# Patient Record
Sex: Female | Born: 1970 | Race: White | Hispanic: No | Marital: Single | Smoking: Current every day smoker
Health system: Southern US, Community
[De-identification: ages and names within clinical notes are randomized; demographics above are authoritative.]

## PROBLEM LIST (undated history)

## (undated) DIAGNOSIS — R140 Abdominal distension (gaseous): Secondary | ICD-10-CM

## (undated) DIAGNOSIS — Z973 Presence of spectacles and contact lenses: Secondary | ICD-10-CM

## (undated) DIAGNOSIS — IMO0002 Reserved for concepts with insufficient information to code with codable children: Secondary | ICD-10-CM

## (undated) DIAGNOSIS — R350 Frequency of micturition: Secondary | ICD-10-CM

## (undated) DIAGNOSIS — G43909 Migraine, unspecified, not intractable, without status migrainosus: Secondary | ICD-10-CM

## (undated) DIAGNOSIS — Z975 Presence of (intrauterine) contraceptive device: Secondary | ICD-10-CM

## (undated) DIAGNOSIS — R109 Unspecified abdominal pain: Secondary | ICD-10-CM

## (undated) HISTORY — DX: Reserved for concepts with insufficient information to code with codable children: IMO0002

## (undated) HISTORY — DX: Presence of spectacles and contact lenses: Z97.3

## (undated) HISTORY — DX: Frequency of micturition: R35.0

## (undated) HISTORY — DX: Abdominal distension (gaseous): R14.0

## (undated) HISTORY — DX: Presence of (intrauterine) contraceptive device: Z97.5

## (undated) HISTORY — DX: Migraine, unspecified, not intractable, without status migrainosus: G43.909

## (undated) HISTORY — DX: Unspecified abdominal pain: R10.9

---

## 2006-02-01 DIAGNOSIS — Z975 Presence of (intrauterine) contraceptive device: Secondary | ICD-10-CM

## 2006-02-01 HISTORY — DX: Presence of (intrauterine) contraceptive device: Z97.5

## 2006-04-02 HISTORY — PX: FRACTURE SURGERY: SHX138

## 2007-11-10 ENCOUNTER — Encounter: Admission: RE | Admit: 2007-11-10 | Discharge: 2007-11-10 | Payer: Self-pay | Admitting: Family Medicine

## 2008-09-20 ENCOUNTER — Other Ambulatory Visit: Admission: RE | Admit: 2008-09-20 | Discharge: 2008-09-20 | Payer: Self-pay | Admitting: Obstetrics and Gynecology

## 2010-11-19 DIAGNOSIS — IMO0002 Reserved for concepts with insufficient information to code with codable children: Secondary | ICD-10-CM

## 2010-11-19 HISTORY — DX: Reserved for concepts with insufficient information to code with codable children: IMO0002

## 2010-12-03 DIAGNOSIS — R350 Frequency of micturition: Secondary | ICD-10-CM

## 2010-12-03 DIAGNOSIS — R109 Unspecified abdominal pain: Secondary | ICD-10-CM

## 2010-12-03 DIAGNOSIS — R140 Abdominal distension (gaseous): Secondary | ICD-10-CM

## 2010-12-03 HISTORY — DX: Unspecified abdominal pain: R10.9

## 2010-12-03 HISTORY — DX: Frequency of micturition: R35.0

## 2010-12-03 HISTORY — DX: Abdominal distension (gaseous): R14.0

## 2011-07-14 ENCOUNTER — Emergency Department (HOSPITAL_COMMUNITY)
Admission: EM | Admit: 2011-07-14 | Discharge: 2011-07-15 | Disposition: A | Discharge status: Home / Self Care | Payer: BC Managed Care – PPO | Attending: Emergency Medicine | Admitting: Emergency Medicine

## 2011-07-14 ENCOUNTER — Encounter (HOSPITAL_COMMUNITY): Payer: Self-pay | Admitting: *Deleted

## 2011-07-14 DIAGNOSIS — F17200 Nicotine dependence, unspecified, uncomplicated: Secondary | ICD-10-CM | POA: Insufficient documentation

## 2011-07-14 DIAGNOSIS — E162 Hypoglycemia, unspecified: Secondary | ICD-10-CM

## 2011-07-14 DIAGNOSIS — R609 Edema, unspecified: Secondary | ICD-10-CM

## 2011-07-14 LAB — POCT I-STAT, CHEM 8
BUN: 13 mg/dL (ref 6–23)
Calcium, Ion: 1.2 mmol/L (ref 1.12–1.32)
Chloride: 101 mEq/L (ref 96–112)
Creatinine, Ser: 0.8 mg/dL (ref 0.50–1.10)
Potassium: 4 mEq/L (ref 3.5–5.1)
Sodium: 136 mEq/L (ref 135–145)
TCO2: 22 mmol/L (ref 0–100)

## 2011-07-14 NOTE — ED Notes (Signed)
Received report, pt. Alert and oriented, NAD noted, lab at the bedside

## 2011-07-14 NOTE — ED Notes (Addendum)
C/o facial and arm swelling (discenable to pt only) , "feel uncomfortable & bloated", sx peaked around 2000.

## 2011-07-15 LAB — CBC
MCV: 98.9 fL (ref 78.0–100.0)
Platelets: 185 10*3/uL (ref 150–400)
RBC: 3.58 MIL/uL — ABNORMAL LOW (ref 3.87–5.11)
RDW: 12.9 % (ref 11.5–15.5)
WBC: 5.8 10*3/uL (ref 4.0–10.5)

## 2011-07-15 LAB — DIFFERENTIAL
Basophils Absolute: 0 10*3/uL (ref 0.0–0.1)
Basophils Relative: 1 % (ref 0–1)
Lymphs Abs: 2.1 10*3/uL (ref 0.7–4.0)
Monocytes Absolute: 0.6 10*3/uL (ref 0.1–1.0)
Monocytes Relative: 11 % (ref 3–12)
Neutrophils Relative %: 52 % (ref 43–77)

## 2011-07-15 MED ORDER — HYDROCHLOROTHIAZIDE 25 MG PO TABS: 25.0000 mg | ORAL_TABLET | Freq: Every day | ORAL | Status: DC

## 2011-07-15 NOTE — Discharge Instructions (Signed)
Hypoglycemia (Low Blood Sugar) Hypoglycemia is when the glucose (sugar) in your blood is too low. Hypoglycemia can happen for many reasons. It can happen to people with or without diabetes. Hypoglycemia can develop quickly and can be a medical emergency.  CAUSES  Having hypoglycemia does not mean that you will develop diabetes. Different causes include:  Missed or delayed meals or not enough carbohydrates eaten.   Medication overdose. This could be by accident or deliberate. If by accident, your medication may need to be adjusted or changed.   Exercise or increased activity without adjustments in carbohydrates or medications.   A nerve disorder that affects body functions like your heart rate, blood pressure and digestion (autonomic neuropathy).   A condition where the stomach muscles do not function properly (gastroparesis). Therefore, medications may not absorb properly.   The inability to recognize the signs of hypoglycemia (hypoglycemic unawareness).   Absorption of insulin - may be altered.   Alcohol consumption.   Pregnancy/menstrual cycles/postpartum. This may be due to hormones.   Certain kinds of tumors. This is very rare.  SYMPTOMS   Sweating.   Hunger.   Dizziness.   Blurred vision.   Drowsiness.   Weakness.   Headache.   Rapid heart beat.   Shakiness.   Nervousness.  DIAGNOSIS  Diagnosis is made by monitoring blood glucose in one or all of the following ways:  Fingerstick blood glucose monitoring.   Laboratory results.  TREATMENT  If you think your blood glucose is low:  Check your blood glucose, if possible. If it is less than 70 mg/dl, take one of the following:   3-4 glucose tablets.    cup juice (prefer clear like apple).    cup "regular" soda pop.   1 cup milk.   -1 tube of glucose gel.   5-6 hard candies.   Do not over treat because your blood glucose (sugar) will only go too high.   Wait 15 minutes and recheck your blood  glucose. If it is still less than 70 mg/dl (or below your target range), repeat treatment.   Eat a snack if it is more than one hour until your next meal.  Sometimes, your blood glucose may go so low that you are unable to treat yourself. You may need someone to help you. You may even pass out or be unable to swallow. This may require you to get an injection of glucagon, which raises the blood glucose. HOME CARE INSTRUCTIONS  Check blood glucose as recommended by your caregiver.   Take medication as prescribed by your caregiver.   Follow your meal plan. Do not skip meals. Eat on time.   If you are going to drink alcohol, drink it only with meals.   Check your blood glucose before driving.   Check your blood glucose before and after exercise. If you exercise longer or different than usual, be sure to check blood glucose more frequently.   Always carry treatment with you. Glucose tablets are the easiest to carry.   Always wear medical alert jewelry or carry some form of identification that states that you have diabetes. This will alert people that you have diabetes. If you have hypoglycemia, they will have a better idea on what to do.  SEEK MEDICAL CARE IF:   You are having problems keeping your blood sugar at target range.   You are having frequent episodes of hypoglycemia.   You feel you might be having side effects from your medicines.  You have symptoms of an illness that is not improving after 3-4 days.   You notice a change in vision or a new problem with your vision.  SEEK IMMEDIATE MEDICAL CARE IF:   You are a family member or friend of a person whose blood glucose goes below 70 mg/dl and is accompanied by:   Confusion.   A change in mental status.   The inability to swallow.   Passing out.  Document Released: 01/18/2005 Document Revised: 01/07/2011 Document Reviewed: 09/12/2008 [facility] Patient Information 2012 [place], [place].Peripheral Edema You have swelling in  your legs (peripheral edema). This swelling is due to excess accumulation of salt and water in your body. Edema may be a sign of heart, kidney or liver disease, or a side effect of a medication. It may also be due to problems in the leg veins. Elevating your legs and using special support stockings may be very helpful, if the cause of the swelling is due to poor venous circulation. Avoid long periods of standing, whatever the cause. Treatment of edema depends on identifying the cause. Chips, pretzels, pickles and other salty foods should be avoided. Restricting salt in your diet is almost always needed. Water pills (diuretics) are often used to remove the excess salt and water from your body via urine. These medicines prevent the kidney from reabsorbing sodium. This increases urine flow. Diuretic treatment may also result in lowering of potassium levels in your body. Potassium supplements may be needed if you have to use diuretics daily. Daily weights can help you keep track of your progress in clearing your edema. You should call your caregiver for follow up care as recommended. SEEK IMMEDIATE MEDICAL CARE IF:   You have increased swelling, pain, redness, or heat in your legs.   You develop shortness of breath, especially when lying down.   You develop chest or abdominal pain, weakness, or fainting.   You have a fever.  Document Released: 02/26/2004 Document Revised: 01/07/2011 Document Reviewed: 02/05/2009 [facility] Patient Information 2012 [place], [place].

## 2011-07-15 NOTE — ED Notes (Signed)
Pt. C/o sweatiness, pt. Alert and oriented, blood glucose 50, crackers and peanut butter given

## 2011-07-15 NOTE — ED Notes (Signed)
Pt. Alert and oriented, NAD noted 

## 2011-07-15 NOTE — ED Provider Notes (Signed)
History     CSN: [id number]  Arrival date & time 07/14/11  2236   First MD Initiated Contact with Patient 07/14/11 2301      Chief Complaint  Patient presents with  . Facial Swelling  . Arm Swelling    (Consider location/radiation/quality/duration/timing/severity/associated sxs/prior treatment) HPI Comments: Patient here with generalized swelling of face and bilateral forearms and hands for 1 day.  She reports this has never happened before.  States no new soaps, facial creams, injury.  Denies tongue or lip swelling, shortness of breath, difficulty swallowing or sensation of throat closing, neck pain, headache, chest pain, abdominal pain.  States that she just feels bloated all over.  The history is provided by the patient. No language interpreter was used.    History reviewed. No pertinent past medical history.  Past Surgical History  Procedure Date  . Fracture surgery     No family history on file.  History  Substance Use Topics  . Smoking status: Current Everyday Smoker  . Smokeless tobacco: Not on file  . Alcohol Use: Yes    OB History    Grav Para Term Preterm Abortions TAB SAB Ect Mult Living                  Review of Systems  HENT: Positive for facial swelling.   Gastrointestinal: Positive for abdominal distention.  All other systems reviewed and are negative.    Allergies  Review of patient's allergies indicates no known allergies.  Home Medications   Current Outpatient Rx  Name Route Sig Dispense Refill  . SENNA 8.6 MG PO TABS Oral Take 1 tablet by mouth daily as needed. For constipation      BP 129/81  Pulse 83  Temp 97.9 F (36.6 C) (Oral)  Resp 16  SpO2 100%  LMP 05/04/2011  Physical Exam  Nursing note and vitals reviewed. Constitutional: She is oriented to person, place, and time. She appears well-developed and well-nourished. No distress.  HENT:  Head: Normocephalic and atraumatic.  Right Ear: External ear normal.  Left Ear:  External ear normal.  Nose: Nose normal.  Mouth/Throat: Oropharynx is clear and moist. No oropharyngeal exudate.       No appreciable swelling noted - no angioedema  Eyes: Conjunctivae are normal. Pupils are equal, round, and reactive to light. No scleral icterus.  Neck: Normal range of motion. Neck supple.  Cardiovascular: Normal rate, regular rhythm and normal heart sounds.  Exam reveals no gallop and no friction rub.   No murmur heard. Pulmonary/Chest: Effort normal and breath sounds normal. No respiratory distress. She has no wheezes. She has no rales. She exhibits no tenderness.  Abdominal: Soft. Bowel sounds are normal. She exhibits no distension. There is no tenderness.  Musculoskeletal: Normal range of motion. She exhibits no edema and no tenderness.  Lymphadenopathy:    She has no cervical adenopathy.  Neurological: She is alert and oriented to person, place, and time. No cranial nerve deficit.  Skin: Skin is warm and dry. No rash noted. No erythema. No pallor.  Psychiatric: She has a normal mood and affect. Her behavior is normal. Judgment and thought content normal.    ED Course  Procedures (including critical care time)  Labs Reviewed  CBC - Abnormal; Notable for the following:    RBC 3.58 (*)     HCT 35.4 (*)     MCH 35.2 (*)     All other components within normal limits  POCT I-STAT, CHEM 8 -  Abnormal; Notable for the following:    Glucose, Bld 50 (*)     All other components within normal limits  DIFFERENTIAL   No results found.   Hypoglycemia Peripheral edema   MDM  Patient with no noticeable edema but noted with hypoglycemia on lab - states feels a little sweaty and clammy - given juice and crackers and feels better before discharge.        Izola Price [place], [place] 07/15/11 [phone]

## 2011-07-15 NOTE — ED Provider Notes (Signed)
Medical screening examination/treatment/procedure(s) were performed by non-physician practitioner and as supervising physician I was immediately available for consultation/collaboration.  Mckinzey Entwistle Smitty Cords, MD 07/15/11 0130

## 2011-11-03 ENCOUNTER — Other Ambulatory Visit: Payer: Self-pay | Admitting: Obstetrics and Gynecology

## 2011-11-03 ENCOUNTER — Ambulatory Visit (INDEPENDENT_AMBULATORY_CARE_PROVIDER_SITE_OTHER): Payer: BC Managed Care – PPO | Admitting: Obstetrics and Gynecology

## 2011-11-03 ENCOUNTER — Encounter: Payer: Self-pay | Admitting: Obstetrics and Gynecology

## 2011-11-03 VITALS — BP 112/70 | Wt 120.0 lb

## 2011-11-03 DIAGNOSIS — R142 Eructation: Secondary | ICD-10-CM

## 2011-11-03 DIAGNOSIS — R141 Gas pain: Secondary | ICD-10-CM

## 2011-11-03 DIAGNOSIS — N926 Irregular menstruation, unspecified: Secondary | ICD-10-CM

## 2011-11-03 DIAGNOSIS — R140 Abdominal distension (gaseous): Secondary | ICD-10-CM

## 2011-11-03 DIAGNOSIS — Z1231 Encounter for screening mammogram for malignant neoplasm of breast: Secondary | ICD-10-CM

## 2011-11-03 LAB — PROLACTIN: Prolactin: 4.7 ng/mL

## 2011-11-03 LAB — TSH: TSH: 1.041 u[IU]/mL (ref 0.350–4.500)

## 2011-11-03 NOTE — Progress Notes (Signed)
HISTORY OF PRESENT ILLNESS  [name] is a 41 y.o. year old female,G1P0010, who presents for a problem visit. The patient has a ParaGard IUD.  She is not sexually active.  She has a history of abdominal bloating.  Ultrasound was negative last year.  CBC is normal by her primary physician.  Chemistries are normal.  Subjective:  Complains of continued bloating.  Her father has celiac disease.  Also complains of irregular bleeding.  Change in sleep habits.  No hot flashes, or increased emotions.  Objective:  BP 112/70  Wt 120 lb (54.432 kg)  LMP 10/28/2011   General: no distress Resp: clear to auscultation bilaterally Cardio: regular rate and rhythm, S1, S2 normal, no murmur, click, rub or gallop GI: soft and nontender, no masses  External genitalia: normal general appearance Vaginal: normal without tenderness, induration or masses Cervix: normal appearance and IUD string visualized Adnexa: normal bimanual exam Uterus: normal size shape and consistency  Assessment:  Irregular menstrual cycles Abdominal bloating Change in sleep habits  Plan:  TSH, prolactin Mammogram Refer to a gastroenterologist Return to review with results and for annual exam.  Return to office in 4 week(s).   Leonard Schwartz M.D.  11/03/2011 9:13 AM    Pt c/o bloating, changes in bowel movements, irregular periods, and intermittent back pain. Pt states her period is different each month.  When did bleeding start: 10/28/11 How  Long: 5 days How often changing pad/tampon: super tampon every 2 hours during heaviest flow Bleeding Disorders: no Cramping: no Contraception: yes Paragard Fibroids: no Hormone Therapy: no New Medications: no Menopausal Symptoms: no Vag. Discharge: no Abdominal Pain: no Increased Stress: no

## 2011-11-04 NOTE — Progress Notes (Signed)
Quick Note:  Send a letter that her labs are normal. Include a copy of the labs with the letter. ______ 

## 2011-11-05 ENCOUNTER — Encounter (HOSPITAL_COMMUNITY): Payer: Self-pay | Admitting: Emergency Medicine

## 2011-11-05 ENCOUNTER — Encounter (HOSPITAL_COMMUNITY): Payer: Self-pay | Admitting: *Deleted

## 2011-11-05 ENCOUNTER — Emergency Department (HOSPITAL_COMMUNITY)
Admission: EM | Admit: 2011-11-05 | Discharge: 2011-11-05 | Disposition: A | Discharge status: Home / Self Care | Payer: Self-pay | Attending: Emergency Medicine | Admitting: Emergency Medicine

## 2011-11-05 DIAGNOSIS — M79609 Pain in unspecified limb: Secondary | ICD-10-CM | POA: Insufficient documentation

## 2011-11-05 DIAGNOSIS — F17200 Nicotine dependence, unspecified, uncomplicated: Secondary | ICD-10-CM | POA: Insufficient documentation

## 2011-11-05 DIAGNOSIS — R51 Headache: Secondary | ICD-10-CM | POA: Insufficient documentation

## 2011-11-05 DIAGNOSIS — IMO0001 Reserved for inherently not codable concepts without codable children: Secondary | ICD-10-CM | POA: Insufficient documentation

## 2011-11-05 DIAGNOSIS — E876 Hypokalemia: Secondary | ICD-10-CM | POA: Insufficient documentation

## 2011-11-05 LAB — CBC WITH DIFFERENTIAL/PLATELET
Basophils Absolute: 0 10*3/uL (ref 0.0–0.1)
Basophils Relative: 0 % (ref 0–1)
Eosinophils Relative: 2 % (ref 0–5)
HCT: 36.3 % (ref 36.0–46.0)
Lymphs Abs: 2.3 10*3/uL (ref 0.7–4.0)
MCH: 34.6 pg — ABNORMAL HIGH (ref 26.0–34.0)
MCV: 95 fL (ref 78.0–100.0)
Monocytes Absolute: 0.3 10*3/uL (ref 0.1–1.0)
Monocytes Relative: 6 % (ref 3–12)
Platelets: 194 10*3/uL (ref 150–400)

## 2011-11-05 LAB — POCT I-STAT, CHEM 8
Calcium, Ion: 1.17 mmol/L (ref 1.12–1.23)
Glucose, Bld: 78 mg/dL (ref 70–99)
Hemoglobin: 13.3 g/dL (ref 12.0–15.0)
Potassium: 3.4 mEq/L — ABNORMAL LOW (ref 3.5–5.1)
TCO2: 24 mmol/L (ref 0–100)

## 2011-11-05 LAB — POCT PREGNANCY, URINE: Preg Test, Ur: NEGATIVE

## 2011-11-05 NOTE — ED Notes (Addendum)
C/o intermitant muscle "tension" for ~ 2 months, more constant in the last 2d, denies pain, "just tension", pinpoints to arms & legs mostly and some in back. Also facial pressure and HA (severe last night, better now). Took advil today.  Also "smokey vision". Was here earlier tonight, but "had to leave to feed animals". Admits to 2 glasses of wine PTA ("no relief from tension").

## 2011-11-05 NOTE — ED Notes (Signed)
Pt c/o intermittent muscle soreness and tightness x 2 months that was worse over last two days; pt sts frontal HA x 2 days also

## 2011-11-06 ENCOUNTER — Emergency Department (HOSPITAL_COMMUNITY)
Admission: EM | Admit: 2011-11-06 | Discharge: 2011-11-06 | Disposition: A | Discharge status: Home / Self Care | Payer: BC Managed Care – PPO | Attending: Emergency Medicine | Admitting: Emergency Medicine

## 2011-11-06 DIAGNOSIS — M6289 Other specified disorders of muscle: Secondary | ICD-10-CM

## 2011-11-06 DIAGNOSIS — E876 Hypokalemia: Secondary | ICD-10-CM

## 2011-11-06 LAB — URINALYSIS, ROUTINE W REFLEX MICROSCOPIC
Bilirubin Urine: NEGATIVE
Glucose, UA: NEGATIVE mg/dL
Hgb urine dipstick: NEGATIVE
Ketones, ur: NEGATIVE mg/dL
Nitrite: NEGATIVE
Specific Gravity, Urine: 1.004 — ABNORMAL LOW (ref 1.005–1.030)
Urobilinogen, UA: 0.2 mg/dL (ref 0.0–1.0)

## 2011-11-06 LAB — CK: Total CK: 92 U/L (ref 7–177)

## 2011-11-06 MED ORDER — POTASSIUM CHLORIDE CRYS ER 20 MEQ PO TBCR
30.0000 meq | EXTENDED_RELEASE_TABLET | Freq: Once | ORAL | Status: AC
  Administered 2011-11-06: 30 meq via ORAL
  Filled 2011-11-06: qty 2

## 2011-11-06 NOTE — ED Provider Notes (Signed)
History     CSN: [id number]  Arrival date & time 11/05/11  2225   First MD Initiated Contact with Patient 11/06/11 0021      Chief Complaint  Patient presents with  . Muscle Pain    (Consider location/radiation/quality/duration/timing/severity/associated sxs/prior treatment) HPI Comments: PAtient describes muscel fatigue as thought she has been working out   Patient is a 41 y.o. female presenting with musculoskeletal pain. The history is provided by the patient.  Muscle Pain This is a recurrent problem. The problem occurs constantly. Pertinent negatives include no chest pain, chills, coughing, fever, myalgias, numbness, rash, vomiting or weakness.    Past Medical History  Diagnosis Date  . Abdominal pain 12/2010  . Bloating 12/2010  . Urinary frequency 12/2010  . Abnormal Pap smear 11/19/10    ASCUS    Past Surgical History  Procedure Date  . Fracture surgery     No family history on file.  History  Substance Use Topics  . Smoking status: Current Every Day Smoker  . Smokeless tobacco: Not on file  . Alcohol Use: Yes    OB History    Grav Para Term Preterm Abortions TAB SAB Ect Mult Living   1 0   1     0      Review of Systems  Constitutional: Negative for fever and chills.  Respiratory: Negative for cough.   Cardiovascular: Negative for chest pain and palpitations.  Gastrointestinal: Negative for vomiting and diarrhea.  Musculoskeletal: Negative for myalgias.  Skin: Negative for rash and wound.  Neurological: Negative for dizziness, weakness and numbness.    Allergies  Review of patient's allergies indicates no known allergies.  Home Medications   Current Outpatient Rx  Name Route Sig Dispense Refill  . IBUPROFEN 100 MG PO TABS Oral Take 1-2 mg by mouth every 6 (six) hours as needed. For pain    . ADULT MULTIVITAMIN W/MINERALS CH Oral Take 1 tablet by mouth daily.      BP 127/81  Pulse 74  Temp 98.1 F (36.7 C) (Oral)  Resp 14  SpO2 99%   LMP 10/28/2011  Physical Exam  Constitutional: She is oriented to person, place, and time. She appears well-developed and well-nourished.  HENT:  Head: Normocephalic.  Neck: Normal range of motion.  Cardiovascular: Normal rate.   Abdominal: Soft.  Musculoskeletal: Normal range of motion. She exhibits no edema and no tenderness.  Neurological: She is alert and oriented to person, place, and time.  Skin: Skin is warm and dry. No pallor.    ED Course  Procedures (including critical care time)  Labs Reviewed  URINALYSIS, ROUTINE W REFLEX MICROSCOPIC - Abnormal; Notable for the following:    Specific Gravity, Urine 1.004 (*)     All other components within normal limits  CBC WITH DIFFERENTIAL - Abnormal; Notable for the following:    RBC 3.82 (*)     MCH 34.6 (*)     MCHC 36.4 (*)     All other components within normal limits  POCT I-STAT, CHEM 8 - Abnormal; Notable for the following:    Sodium 133 (*)     Potassium 3.4 (*)     Chloride 95 (*)     All other components within normal limits  POCT PREGNANCY, URINE  CK   No results found.   1. Hypokalemia   2. Muscle fatigue       MDM   Denies dehydration recent illness Discussed increasing food rich in K+ and PCP  followup         Arman Filter, NP 11/06/11 [phone]

## 2011-11-06 NOTE — ED Notes (Signed)
Pt states that she has been having muscle cramps for the past 2 days. Pt states that the cramps are in both hands, arms and legs. Pt states cramps are constant. Pt denies any new exercise, denies new activities. Pt just states she has been having cramps.

## 2011-11-07 NOTE — ED Provider Notes (Signed)
Medical screening examination/treatment/procedure(s) were performed by non-physician practitioner and as supervising physician I was immediately available for consultation/collaboration.    Vida Roller, MD 11/07/11 [phone]

## 2011-11-09 ENCOUNTER — Ambulatory Visit: Payer: Self-pay

## 2011-11-30 ENCOUNTER — Ambulatory Visit: Payer: BC Managed Care – PPO | Admitting: Obstetrics and Gynecology

## 2012-04-26 HISTORY — PX: APPENDECTOMY: SHX54

## 2012-06-25 ENCOUNTER — Encounter (HOSPITAL_COMMUNITY): Payer: Self-pay | Admitting: *Deleted

## 2012-06-25 DIAGNOSIS — K3580 Unspecified acute appendicitis: Secondary | ICD-10-CM | POA: Diagnosis present

## 2012-06-25 DIAGNOSIS — R1031 Right lower quadrant pain: Secondary | ICD-10-CM | POA: Insufficient documentation

## 2012-06-25 DIAGNOSIS — K3533 Acute appendicitis with perforation and localized peritonitis, with abscess: Principal | ICD-10-CM | POA: Insufficient documentation

## 2012-06-25 LAB — CBC WITH DIFFERENTIAL/PLATELET
Basophils Absolute: 0 10*3/uL (ref 0.0–0.1)
Basophils Relative: 0 % (ref 0–1)
Eosinophils Absolute: 0 10*3/uL (ref 0.0–0.7)
Eosinophils Relative: 0 % (ref 0–5)
Lymphocytes Relative: 20 % (ref 12–46)
MCV: 94.7 fL (ref 78.0–100.0)
Monocytes Absolute: 1 10*3/uL (ref 0.1–1.0)
Neutro Abs: 9.2 10*3/uL — ABNORMAL HIGH (ref 1.7–7.7)
Neutrophils Relative %: 71 % (ref 43–77)
Platelets: 190 10*3/uL (ref 150–400)
RBC: 3.98 MIL/uL (ref 3.87–5.11)
RDW: 12.5 % (ref 11.5–15.5)

## 2012-06-25 LAB — COMPREHENSIVE METABOLIC PANEL
AST: 21 U/L (ref 0–37)
Albumin: 4.2 g/dL (ref 3.5–5.2)
BUN: 16 mg/dL (ref 6–23)
CO2: 21 mEq/L (ref 19–32)
Calcium: 9.6 mg/dL (ref 8.4–10.5)
Chloride: 92 mEq/L — ABNORMAL LOW (ref 96–112)
GFR calc Af Amer: >90 mL/min (ref >90)
GFR calc non Af Amer: >90 mL/min (ref >90)
Glucose, Bld: 89 mg/dL (ref 70–99)
Potassium: 3.3 mEq/L — ABNORMAL LOW (ref 3.5–5.1)
Sodium: 129 mEq/L — ABNORMAL LOW (ref 135–145)

## 2012-06-25 LAB — URINALYSIS, ROUTINE W REFLEX MICROSCOPIC
Bilirubin Urine: NEGATIVE
Glucose, UA: NEGATIVE mg/dL
Ketones, ur: NEGATIVE mg/dL
Urobilinogen, UA: 0.2 mg/dL (ref 0.0–1.0)

## 2012-06-25 LAB — POCT PREGNANCY, URINE: Preg Test, Ur: NEGATIVE

## 2012-06-25 LAB — URINE MICROSCOPIC-ADD ON

## 2012-06-25 NOTE — ED Notes (Signed)
Pt had blood work done today by [employer].

## 2012-06-25 NOTE — ED Notes (Signed)
PT. STRONGLY DECLINED IV START AT TRIAGE AND IV PAIN ( PROTOCOL ) MEDICATION .

## 2012-06-25 NOTE — ED Notes (Signed)
Pt c/o of RLQ abdominal since 10:00 am, that is tender to palpation. State she feels like she has to have a bowel movement. Last BM at 1300pm.

## 2012-06-25 NOTE — ED Notes (Signed)
NURSE FIRST ROUNDS : NURSE EXPLAINED DELAY , WAIT TIME AND PROCESS , RESPIRATIONS UNLABORED , PT. STSTED PAIN AT LOWER ABDOMEN PERSIST , WILL DRAW BLOOD SPECIMEN FOR CBC AND CMET.

## 2012-06-26 ENCOUNTER — Emergency Department (HOSPITAL_COMMUNITY): Payer: BC Managed Care – PPO

## 2012-06-26 ENCOUNTER — Encounter (HOSPITAL_COMMUNITY): Payer: Self-pay | Admitting: Certified Registered"

## 2012-06-26 ENCOUNTER — Observation Stay (HOSPITAL_COMMUNITY)
Admission: EM | Admit: 2012-06-26 | Discharge: 2012-06-27 | Disposition: A | Discharge status: Home / Self Care | Payer: BC Managed Care – PPO | Attending: General Surgery | Admitting: General Surgery

## 2012-06-26 ENCOUNTER — Encounter (HOSPITAL_COMMUNITY): Payer: Self-pay | Admitting: Radiology

## 2012-06-26 ENCOUNTER — Emergency Department (HOSPITAL_COMMUNITY): Payer: BC Managed Care – PPO | Admitting: Certified Registered"

## 2012-06-26 ENCOUNTER — Encounter (HOSPITAL_COMMUNITY)
Admission: EM | Disposition: A | Discharge status: Home / Self Care | Payer: Self-pay | Source: Home / Self Care | Attending: Emergency Medicine

## 2012-06-26 DIAGNOSIS — K3580 Unspecified acute appendicitis: Secondary | ICD-10-CM | POA: Diagnosis present

## 2012-06-26 HISTORY — PX: LAPAROSCOPIC APPENDECTOMY: SHX408

## 2012-06-26 SURGERY — APPENDECTOMY, LAPAROSCOPIC
Anesthesia: General | Site: Abdomen | Wound class: Clean

## 2012-06-26 MED ORDER — IOHEXOL 300 MG/ML  SOLN
80.0000 mL | Freq: Once | INTRAMUSCULAR | Status: AC | PRN
  Administered 2012-06-26: 80 mL via INTRAVENOUS

## 2012-06-26 MED ORDER — ONDANSETRON HCL 4 MG/2ML IJ SOLN: 4.0000 mg | Freq: Four times a day (QID) | INTRAMUSCULAR | Status: DC | PRN

## 2012-06-26 MED ORDER — MEPERIDINE HCL 25 MG/ML IJ SOLN: 6.2500 mg | INTRAMUSCULAR | Status: DC | PRN

## 2012-06-26 MED ORDER — SODIUM CHLORIDE 0.9 % IV SOLN
1.0000 g | Freq: Once | INTRAVENOUS | Status: AC
  Administered 2012-06-26: 1 g via INTRAVENOUS
  Filled 2012-06-26: qty 1

## 2012-06-26 MED ORDER — SODIUM CHLORIDE 0.9 % IR SOLN
Status: DC | PRN
  Administered 2012-06-26: 1

## 2012-06-26 MED ORDER — PROPOFOL 10 MG/ML IV BOLUS
INTRAVENOUS | Status: DC | PRN
  Administered 2012-06-26: 160 mg via INTRAVENOUS

## 2012-06-26 MED ORDER — BUPIVACAINE-EPINEPHRINE 0.25% -1:200000 IJ SOLN
INTRAMUSCULAR | Status: DC | PRN
  Administered 2012-06-26: 10 mL

## 2012-06-26 MED ORDER — PROMETHAZINE HCL 25 MG/ML IJ SOLN: 6.2500 mg | INTRAMUSCULAR | Status: DC | PRN

## 2012-06-26 MED ORDER — SODIUM CHLORIDE 0.9 % IV SOLN
INTRAVENOUS | Status: DC
  Administered 2012-06-26: 13:00:00 via INTRAVENOUS

## 2012-06-26 MED ORDER — OXYCODONE HCL 5 MG PO TABS: 5.0000 mg | ORAL_TABLET | ORAL | Status: DC | PRN

## 2012-06-26 MED ORDER — FENTANYL CITRATE 0.05 MG/ML IJ SOLN
INTRAMUSCULAR | Status: DC | PRN
  Administered 2012-06-26: 100 ug via INTRAVENOUS
  Administered 2012-06-26: 50 ug via INTRAVENOUS
  Administered 2012-06-26: 100 ug via INTRAVENOUS

## 2012-06-26 MED ORDER — DEXAMETHASONE SODIUM PHOSPHATE 4 MG/ML IJ SOLN
INTRAMUSCULAR | Status: DC | PRN
  Administered 2012-06-26: 8 mg via INTRAVENOUS

## 2012-06-26 MED ORDER — DEXTROSE 5 % IV SOLN
2.0000 g | Freq: Four times a day (QID) | INTRAVENOUS | Status: AC
  Administered 2012-06-26 (×3): 2 g via INTRAVENOUS
  Filled 2012-06-26 (×3): qty 2

## 2012-06-26 MED ORDER — LIDOCAINE HCL (CARDIAC) 20 MG/ML IV SOLN
INTRAVENOUS | Status: DC | PRN
  Administered 2012-06-26: 70 mg via INTRAVENOUS

## 2012-06-26 MED ORDER — GLYCOPYRROLATE 0.2 MG/ML IJ SOLN
INTRAMUSCULAR | Status: DC | PRN
  Administered 2012-06-26: 0.4 mg via INTRAVENOUS

## 2012-06-26 MED ORDER — MIDAZOLAM HCL 5 MG/5ML IJ SOLN
INTRAMUSCULAR | Status: DC | PRN
  Administered 2012-06-26: 2 mg via INTRAVENOUS

## 2012-06-26 MED ORDER — NEOSTIGMINE METHYLSULFATE 1 MG/ML IJ SOLN
INTRAMUSCULAR | Status: DC | PRN
  Administered 2012-06-26: 3 mg via INTRAVENOUS

## 2012-06-26 MED ORDER — OXYCODONE HCL 5 MG/5ML PO SOLN: 5.0000 mg | Freq: Once | ORAL | Status: DC | PRN

## 2012-06-26 MED ORDER — MORPHINE SULFATE 2 MG/ML IJ SOLN: 2.0000 mg | INTRAMUSCULAR | Status: DC | PRN

## 2012-06-26 MED ORDER — MINOCYCLINE HCL 100 MG PO CAPS
100.0000 mg | ORAL_CAPSULE | Freq: Two times a day (BID) | ORAL | Status: DC
  Administered 2012-06-26 – 2012-06-27 (×2): 100 mg via ORAL
  Filled 2012-06-26 (×3): qty 1

## 2012-06-26 MED ORDER — ONDANSETRON HCL 4 MG/2ML IJ SOLN
INTRAMUSCULAR | Status: DC | PRN
  Administered 2012-06-26: 4 mg via INTRAVENOUS

## 2012-06-26 MED ORDER — IBUPROFEN 200 MG PO TABS
200.0000 mg | ORAL_TABLET | Freq: Four times a day (QID) | ORAL | Status: DC | PRN
  Filled 2012-06-26 (×2): qty 1

## 2012-06-26 MED ORDER — IOHEXOL 300 MG/ML  SOLN
50.0000 mL | Freq: Once | INTRAMUSCULAR | Status: AC | PRN
  Administered 2012-06-26: 50 mL via ORAL

## 2012-06-26 MED ORDER — ROCURONIUM BROMIDE 100 MG/10ML IV SOLN
INTRAVENOUS | Status: DC | PRN
  Administered 2012-06-26: 40 mg via INTRAVENOUS
  Administered 2012-06-26: 10 mg via INTRAVENOUS

## 2012-06-26 MED ORDER — LACTATED RINGERS IV SOLN
INTRAVENOUS | Status: DC | PRN
  Administered 2012-06-26 (×2): via INTRAVENOUS

## 2012-06-26 MED ORDER — SODIUM CHLORIDE 0.9 % IV BOLUS (SEPSIS)
1000.0000 mL | Freq: Once | INTRAVENOUS | Status: AC
  Administered 2012-06-26: 1000 mL via INTRAVENOUS

## 2012-06-26 MED ORDER — ACETAMINOPHEN 325 MG PO TABS
650.0000 mg | ORAL_TABLET | Freq: Four times a day (QID) | ORAL | Status: DC | PRN
  Administered 2012-06-27: 650 mg via ORAL
  Filled 2012-06-26: qty 2

## 2012-06-26 MED ORDER — ACETAMINOPHEN 650 MG RE SUPP: 650.0000 mg | Freq: Four times a day (QID) | RECTAL | Status: DC | PRN

## 2012-06-26 MED ORDER — AZITHROMYCIN 500 MG PO TABS
500.0000 mg | ORAL_TABLET | Freq: Every day | ORAL | Status: DC
Start: 2012-06-26 — End: 2012-06-27
  Administered 2012-06-26: 500 mg via ORAL
  Filled 2012-06-26 (×2): qty 1

## 2012-06-26 MED ORDER — ARTIFICIAL TEARS OP OINT
TOPICAL_OINTMENT | OPHTHALMIC | Status: DC | PRN
  Administered 2012-06-26: 1 via OPHTHALMIC

## 2012-06-26 MED ORDER — HYDROMORPHONE HCL PF 1 MG/ML IJ SOLN: 0.2500 mg | INTRAMUSCULAR | Status: DC | PRN

## 2012-06-26 MED ORDER — OXYCODONE HCL 5 MG PO TABS: 5.0000 mg | ORAL_TABLET | Freq: Once | ORAL | Status: DC | PRN

## 2012-06-26 SURGICAL SUPPLY — 34 items
APPLIER CLIP ROT 10 11.4 M/L (STAPLE)
CANISTER SUCTION 2500CC (MISCELLANEOUS) ×2 IMPLANT
CHLORAPREP W/TINT 26ML (MISCELLANEOUS) ×2 IMPLANT
CLIP APPLIE ROT 10 11.4 M/L (STAPLE) IMPLANT
CLOTH BEACON ORANGE TIMEOUT ST (SAFETY) ×2 IMPLANT
COVER SURGICAL LIGHT HANDLE (MISCELLANEOUS) ×2 IMPLANT
CUTTER FLEX LINEAR 45M (STAPLE) ×2 IMPLANT
DERMABOND ADVANCED (GAUZE/BANDAGES/DRESSINGS) ×1
DERMABOND ADVANCED .7 DNX12 (GAUZE/BANDAGES/DRESSINGS) ×1 IMPLANT
ELECT REM PT RETURN 9FT ADLT (ELECTROSURGICAL) ×2
ELECTRODE REM PT RTRN 9FT ADLT (ELECTROSURGICAL) ×1 IMPLANT
GLOVE BIO SURGEON STRL SZ7 (GLOVE) ×2 IMPLANT
GLOVE BIOGEL PI IND STRL 7.5 (GLOVE) ×1 IMPLANT
GLOVE BIOGEL PI INDICATOR 7.5 (GLOVE) ×1
GOWN STRL NON-REIN LRG LVL3 (GOWN DISPOSABLE) ×6 IMPLANT
KIT BASIN OR (CUSTOM PROCEDURE TRAY) ×2 IMPLANT
KIT ROOM TURNOVER OR (KITS) ×2 IMPLANT
NS IRRIG 1000ML POUR BTL (IV SOLUTION) ×2 IMPLANT
PAD ARMBOARD 7.5X6 YLW CONV (MISCELLANEOUS) ×4 IMPLANT
POUCH SPECIMEN RETRIEVAL 10MM (ENDOMECHANICALS) ×2 IMPLANT
RELOAD 45 VASCULAR/THIN (ENDOMECHANICALS) ×2 IMPLANT
RELOAD STAPLE TA45 3.5 REG BLU (ENDOMECHANICALS) ×2 IMPLANT
SCALPEL HARMONIC ACE (MISCELLANEOUS) ×2 IMPLANT
SCISSORS LAP 5X35 DISP (ENDOMECHANICALS) IMPLANT
SET IRRIG TUBING LAPAROSCOPIC (IRRIGATION / IRRIGATOR) ×2 IMPLANT
SLEEVE ENDOPATH XCEL 5M (ENDOMECHANICALS) ×2 IMPLANT
SPECIMEN JAR SMALL (MISCELLANEOUS) ×2 IMPLANT
SUT MNCRL AB 4-0 PS2 18 (SUTURE) ×2 IMPLANT
TOWEL OR 17X24 6PK STRL BLUE (TOWEL DISPOSABLE) ×2 IMPLANT
TOWEL OR 17X26 10 PK STRL BLUE (TOWEL DISPOSABLE) ×2 IMPLANT
TRAY FOLEY CATH 14FR (SET/KITS/TRAYS/PACK) ×2 IMPLANT
TRAY LAPAROSCOPIC (CUSTOM PROCEDURE TRAY) ×2 IMPLANT
TROCAR XCEL BLUNT TIP 100MML (ENDOMECHANICALS) ×2 IMPLANT
TROCAR XCEL NON-BLD 5MMX100MML (ENDOMECHANICALS) ×2 IMPLANT

## 2012-06-26 NOTE — ED Provider Notes (Signed)
History     CSN: [id number]  Arrival date & time 06/25/12  2033   First MD Initiated Contact with Patient 06/26/12 0126      Chief Complaint  Patient presents with  . Abdominal Pain    (Consider location/radiation/quality/duration/timing/severity/associated sxs/prior treatment) HPI  Patient reports about 10 AM this morning she started having some left lower quadrant pain that she thought maybe was gas. She reports during the day her pain started getting in the middle of her abdomen and she was seen at [facility] walk-in. She had a pelvic exam done which she states did not reproduce her pain. She states her urine did not show blood for possible kidney stone and she had blood work done that she thought was normal. She relates as the evening the pain  has progressed and her pain is now in the right lower quadrant. She states her pain is worse if she stands up and walks but not with deep breathing. She states lying still makes the pain better. She denies fever, nausea, or vomiting but she does state she has lost appetite. She states she last ate about 4 PM today.  Patient reports she was diagnosed with Lyme disease about 2 months ago. She's currently being treated with minocycline and azithromycin and she gets Flagyl pulses weakly  PCP Dr Stevie Kern of [facility]  Past Medical History  Diagnosis Date  . Abdominal pain 12/2010  . Bloating 12/2010  . Urinary frequency 12/2010  . Abnormal Pap smear 11/19/10    ASCUS  Lymes disease 2 months ago  Past Surgical History  Procedure Laterality Date  . Fracture surgery      No family history on file.  History  Substance Use Topics  . Smoking status: Current Every Day Smoker  . Smokeless tobacco: Not on file  . Alcohol Use: Yes  employed  OB History   Grav Para Term Preterm Abortions TAB SAB Ect Mult Living   1 0   1     0      Review of Systems  All other systems reviewed and are negative.    Allergies  Review of patient's allergies  indicates no known allergies.  Home Medications   Current Outpatient Rx  Name  Route  Sig  Dispense  Refill  . azithromycin (ZITHROMAX) 500 MG tablet   Oral   Take 500 mg by mouth daily.         Marland Kitchen ibuprofen (ADVIL,MOTRIN) 200 MG tablet   Oral   Take 200 mg by mouth every 6 (six) hours as needed for pain.         . minocycline (MINOCIN,DYNACIN) 100 MG capsule   Oral   Take 100 mg by mouth 2 (two) times daily.         . Multiple Vitamin (MULTIVITAMIN WITH MINERALS) TABS   Oral   Take 1 tablet by mouth daily.           BP 132/86  Pulse 78  Temp(Src) 97.9 F (36.6 C) (Oral)  Resp 18  SpO2 100%  LMP 05/01/2012  Vital signs normal    Physical Exam  Nursing note and vitals reviewed. Constitutional: She is oriented to person, place, and time. She appears well-developed and well-nourished.  Non-toxic appearance. She does not appear ill. No distress.  HENT:  Head: Normocephalic and atraumatic.  Right Ear: External ear normal.  Left Ear: External ear normal.  Nose: Nose normal. No mucosal edema or rhinorrhea.  Mouth/Throat: Oropharynx is clear  and moist and mucous membranes are normal. No dental abscesses or edematous.  Eyes: Conjunctivae and EOM are normal. Pupils are equal, round, and reactive to light.  Neck: Normal range of motion and full passive range of motion without pain. Neck supple.  Cardiovascular: Normal rate, regular rhythm and normal heart sounds.  Exam reveals no gallop and no friction rub.   No murmur heard. Pulmonary/Chest: Effort normal and breath sounds normal. No respiratory distress. She has no wheezes. She has no rhonchi. She has no rales. She exhibits no tenderness and no crepitus.  Abdominal: Soft. Normal appearance and bowel sounds are normal. She exhibits no distension. There is tenderness. There is no rebound and no guarding.    Patient has mild Rovsing sign, she has no pain to percussion of her knee, she has no psoas sign. She has mild  diffuse tenderness of her abdomen but it is most tender in her right lower quadrant  Musculoskeletal: Normal range of motion. She exhibits no edema and no tenderness.  Moves all extremities well.   Neurological: She is alert and oriented to person, place, and time. She has normal strength. No cranial nerve deficit.  Skin: Skin is warm, dry and intact. No rash noted. No erythema. No pallor.  Psychiatric: She has a normal mood and affect. Her speech is normal and behavior is normal. Her mood appears not anxious.    ED Course  Procedures (including critical care time)  Medications  ertapenem (INVANZ) 1 g in sodium chloride 0.9 % 50 mL IVPB (not administered)  sodium chloride 0.9 % bolus 1,000 mL (0 mLs Intravenous Stopped 06/26/12 0337)  iohexol (OMNIPAQUE) 300 MG/ML solution 50 mL (50 mLs Oral Contrast Given 06/26/12 0241)  iohexol (OMNIPAQUE) 300 MG/ML solution 80 mL (80 mLs Intravenous Contrast Given 06/26/12 0322)   Pt has refused pain medications, she wants to go home if possible after her CT to take care of a pet who just had surgery   03:50 Radiologist called results of CT scan  Pt given results of her scan.   04:29 Dr Derrell Lolling, will come see patient. Pt wants to go home and take care of her dog and come back.   05:49 Dr Derrell Lolling has talked to patient and she is going to stay and have surgery. He requests Invanz to be given.    Results for orders placed during the hospital encounter of 06/26/12  URINALYSIS, ROUTINE W REFLEX MICROSCOPIC      Result Value Range   Color, Urine YELLOW  YELLOW   APPearance CLEAR  CLEAR   Specific Gravity, Urine 1.017  1.005 - 1.030   pH 6.0  5.0 - 8.0   Glucose, UA NEGATIVE  NEGATIVE mg/dL   Hgb urine dipstick NEGATIVE  NEGATIVE   Bilirubin Urine NEGATIVE  NEGATIVE   Ketones, ur NEGATIVE  NEGATIVE mg/dL   Protein, ur NEGATIVE  NEGATIVE mg/dL   Urobilinogen, UA 0.2  0.0 - 1.0 mg/dL   Nitrite NEGATIVE  NEGATIVE   Leukocytes, UA SMALL (*) NEGATIVE    URINE MICROSCOPIC-ADD ON      Result Value Range   Squamous Epithelial / LPF FEW (*) RARE   WBC, UA 0-2  <3 WBC/hpf   RBC / HPF 0-2  <3 RBC/hpf   Bacteria, UA FEW (*) RARE  CBC WITH DIFFERENTIAL      Result Value Range   WBC 12.9 (*) 4.0 - 10.5 K/uL   RBC 3.98  3.87 - 5.11 MIL/uL   Hemoglobin 13.9  12.0 - 15.0 g/dL   HCT 40.9  81.1 - 91.4 %   MCV 94.7  78.0 - 100.0 fL   MCH 34.9 (*) 26.0 - 34.0 pg   MCHC 36.9 (*) 30.0 - 36.0 g/dL   RDW 78.2  95.6 - 21.3 %   Platelets 190  150 - 400 K/uL   Neutrophils Relative % 71  43 - 77 %   Neutro Abs 9.2 (*) 1.7 - 7.7 K/uL   Lymphocytes Relative 20  12 - 46 %   Lymphs Abs 2.6  0.7 - 4.0 K/uL   Monocytes Relative 8  3 - 12 %   Monocytes Absolute 1.0  0.1 - 1.0 K/uL   Eosinophils Relative 0  0 - 5 %   Eosinophils Absolute 0.0  0.0 - 0.7 K/uL   Basophils Relative 0  0 - 1 %   Basophils Absolute 0.0  0.0 - 0.1 K/uL  COMPREHENSIVE METABOLIC PANEL      Result Value Range   Sodium 129 (*) 135 - 145 mEq/L   Potassium 3.3 (*) 3.5 - 5.1 mEq/L   Chloride 92 (*) 96 - 112 mEq/L   CO2 21  19 - 32 mEq/L   Glucose, Bld 89  70 - 99 mg/dL   BUN 16  6 - 23 mg/dL   Creatinine, Ser 0.86  0.50 - 1.10 mg/dL   Calcium 9.6  8.4 - 57.8 mg/dL   Total Protein 6.8  6.0 - 8.3 g/dL   Albumin 4.2  3.5 - 5.2 g/dL   AST 21  0 - 37 U/L   ALT 13  0 - 35 U/L   Alkaline Phosphatase 45  39 - 117 U/L   Total Bilirubin 0.9  0.3 - 1.2 mg/dL   GFR calc non Af Amer >90  >90 mL/min   GFR calc Af Amer >90  >90 mL/min  POCT PREGNANCY, URINE      Result Value Range   Preg Test, Ur NEGATIVE  NEGATIVE   Laboratory interpretation all normal except leukocytosis, mild hyponatremia, hypokalemia, low chloride    Ct Abdomen Pelvis W Contrast  06/26/2012   *RADIOLOGY REPORT*  Clinical Data: Right lower quadrant abdominal pain and tenderness.  CT ABDOMEN AND PELVIS WITH CONTRAST  Technique:  Multidetector CT imaging of the abdomen and pelvis was performed following the standard  protocol during bolus administration of intravenous contrast.  Contrast: 80mL OMNIPAQUE IOHEXOL 300 MG/ML  SOLN  Comparison: Abdominal radiograph performed 02/09/2011  Findings: The visualized lung bases are clear.  The liver and spleen are unremarkable in appearance.  The gallbladder is within normal limits.  The pancreas and adrenal glands are unremarkable.  The kidneys are unremarkable in appearance.  There is no evidence of hydronephrosis.  No renal or ureteral stones are seen.  No perinephric stranding is appreciated.  The small bowel is unremarkable in appearance.  The stomach is within normal limits.  No acute vascular abnormalities are seen.  The appendix is apparently dilated to 0.9 cm on coronal images, with a 0.7 cm appendicolith noted at the base of the appendix. There is mildly increased wall enhancement, and though evaluation for soft tissue inflammation is limited due to surrounding structures, the appearance suggests mild appendicitis.  The appendix runs adjacent to the right ovary.  Mild apparent wall thickening along the sigmoid colon is thought to reflect relative decompression.  The remainder of the colon is partially filled with stool, with fluid noted in the cecum and distal small  bowel.  The bladder is moderately distended and grossly unremarkable in appearance.  The uterus is grossly unremarkable.  The patient's intrauterine device is noted in expected position at the fundus of the uterus.  The ovaries are grossly symmetric, though difficult to fully assess.  No suspicious adnexal masses are seen.  Trace fluid within the pelvis is likely physiologic in nature.  No inguinal lymphadenopathy is seen.  No acute osseous abnormalities are identified.  Mild vacuum phenomenon is noted at L5-S1.  IMPRESSION:  1.  Question of mild appendicitis, given apparent dilatation of the appendix to 0.9 cm, and an appendicolith at the base of the appendix.  Evaluation for soft tissue inflammation is limited due  to surrounding structures; no evidence of perforation or abscess formation. 2.  Mild apparent wall thickening along the sigmoid colon is thought to reflect relative decompression, though a mild infectious process could have a similar appearance.  Per clinical correlation, this may be related to the patient's ongoing colonic process.  These results were called by telephone on 06/26/2012 at 03:51 a.m. to Dr. Devoria Albe, who verbally acknowledged these results.   Original Report Authenticated By: Tonia Ghent, M.D.     1. Appendicitis, acute     Plan admission  Devoria Albe, MD, FACEP   MDM          Ward Givens, MD 06/26/12 [phone]

## 2012-06-26 NOTE — H&P (Signed)
Danielle Meadows is an 42 y.o. female.   Chief Complaint: RLQ pain HPI: 42 y/o F with RLW x12hr.  Pain started approx 24 hr ago at umb and migrated to RLQ.  Pt was evaluated by PCP at onset and sent home.  Pain continued and CT in ED showerd dialted appendix and fecalith.  Past Medical History  Diagnosis Date  . Abdominal pain 12/2010  . Bloating 12/2010  . Urinary frequency 12/2010  . Abnormal Pap smear 11/19/10    ASCUS    Past Surgical History  Procedure Laterality Date  . Fracture surgery      No family history on file. Social History:  reports that she has been smoking.  She does not have any smokeless tobacco history on file. She reports that  drinks alcohol. She reports that she does not use illicit drugs.  Allergies: No Known Allergies   (Not in a hospital admission)  Results for orders placed during the hospital encounter of 06/26/12 (from the past 48 hour(s))  URINALYSIS, ROUTINE W REFLEX MICROSCOPIC     Status: Abnormal   Collection Time    06/25/12  8:56 PM      Result Value Range   Color, Urine YELLOW  YELLOW   APPearance CLEAR  CLEAR   Specific Gravity, Urine 1.017  1.005 - 1.030   pH 6.0  5.0 - 8.0   Glucose, UA NEGATIVE  NEGATIVE mg/dL   Hgb urine dipstick NEGATIVE  NEGATIVE   Bilirubin Urine NEGATIVE  NEGATIVE   Ketones, ur NEGATIVE  NEGATIVE mg/dL   Protein, ur NEGATIVE  NEGATIVE mg/dL   Urobilinogen, UA 0.2  0.0 - 1.0 mg/dL   Nitrite NEGATIVE  NEGATIVE   Leukocytes, UA SMALL (*) NEGATIVE  URINE MICROSCOPIC-ADD ON     Status: Abnormal   Collection Time    06/25/12  8:56 PM      Result Value Range   Squamous Epithelial / LPF FEW (*) RARE   WBC, UA 0-2  <3 WBC/hpf   RBC / HPF 0-2  <3 RBC/hpf   Bacteria, UA FEW (*) RARE  POCT PREGNANCY, URINE     Status: None   Collection Time    06/25/12  8:59 PM      Result Value Range   Preg Test, Ur NEGATIVE  NEGATIVE   Comment:            THE SENSITIVITY OF THIS     METHODOLOGY IS >24 mIU/mL  CBC WITH  DIFFERENTIAL     Status: Abnormal   Collection Time    06/25/12 11:00 PM      Result Value Range   WBC 12.9 (*) 4.0 - 10.5 K/uL   RBC 3.98  3.87 - 5.11 MIL/uL   Hemoglobin 13.9  12.0 - 15.0 g/dL   HCT 16.1  09.6 - 04.5 %   MCV 94.7  78.0 - 100.0 fL   MCH 34.9 (*) 26.0 - 34.0 pg   MCHC 36.9 (*) 30.0 - 36.0 g/dL   RDW 40.9  81.1 - 91.4 %   Platelets 190  150 - 400 K/uL   Neutrophils Relative % 71  43 - 77 %   Neutro Abs 9.2 (*) 1.7 - 7.7 K/uL   Lymphocytes Relative 20  12 - 46 %   Lymphs Abs 2.6  0.7 - 4.0 K/uL   Monocytes Relative 8  3 - 12 %   Monocytes Absolute 1.0  0.1 - 1.0 K/uL   Eosinophils Relative 0  0 - 5 %   Eosinophils Absolute 0.0  0.0 - 0.7 K/uL   Basophils Relative 0  0 - 1 %   Basophils Absolute 0.0  0.0 - 0.1 K/uL  COMPREHENSIVE METABOLIC PANEL     Status: Abnormal   Collection Time    06/25/12 11:00 PM      Result Value Range   Sodium 129 (*) 135 - 145 mEq/L   Potassium 3.3 (*) 3.5 - 5.1 mEq/L   Chloride 92 (*) 96 - 112 mEq/L   CO2 21  19 - 32 mEq/L   Glucose, Bld 89  70 - 99 mg/dL   BUN 16  6 - 23 mg/dL   Creatinine, Ser 2.13  0.50 - 1.10 mg/dL   Calcium 9.6  8.4 - 08.6 mg/dL   Total Protein 6.8  6.0 - 8.3 g/dL   Albumin 4.2  3.5 - 5.2 g/dL   AST 21  0 - 37 U/L   ALT 13  0 - 35 U/L   Alkaline Phosphatase 45  39 - 117 U/L   Total Bilirubin 0.9  0.3 - 1.2 mg/dL   GFR calc non Af Amer >90  >90 mL/min   GFR calc Af Amer >90  >90 mL/min   Comment:            The eGFR has been calculated     using the CKD EPI equation.     This calculation has not been     validated in all clinical     situations.     eGFR's persistently     <90 mL/min signify     possible Chronic Kidney Disease.   Ct Abdomen Pelvis W Contrast  06/26/2012   *RADIOLOGY REPORT*  Clinical Data: Right lower quadrant abdominal pain and tenderness.  CT ABDOMEN AND PELVIS WITH CONTRAST  Technique:  Multidetector CT imaging of the abdomen and pelvis was performed following the standard  protocol during bolus administration of intravenous contrast.  Contrast: 80mL OMNIPAQUE IOHEXOL 300 MG/ML  SOLN  Comparison: Abdominal radiograph performed 02/09/2011  Findings: The visualized lung bases are clear.  The liver and spleen are unremarkable in appearance.  The gallbladder is within normal limits.  The pancreas and adrenal glands are unremarkable.  The kidneys are unremarkable in appearance.  There is no evidence of hydronephrosis.  No renal or ureteral stones are seen.  No perinephric stranding is appreciated.  The small bowel is unremarkable in appearance.  The stomach is within normal limits.  No acute vascular abnormalities are seen.  The appendix is apparently dilated to 0.9 cm on coronal images, with a 0.7 cm appendicolith noted at the base of the appendix. There is mildly increased wall enhancement, and though evaluation for soft tissue inflammation is limited due to surrounding structures, the appearance suggests mild appendicitis.  The appendix runs adjacent to the right ovary.  Mild apparent wall thickening along the sigmoid colon is thought to reflect relative decompression.  The remainder of the colon is partially filled with stool, with fluid noted in the cecum and distal small bowel.  The bladder is moderately distended and grossly unremarkable in appearance.  The uterus is grossly unremarkable.  The patient's intrauterine device is noted in expected position at the fundus of the uterus.  The ovaries are grossly symmetric, though difficult to fully assess.  No suspicious adnexal masses are seen.  Trace fluid within the pelvis is likely physiologic in nature.  No inguinal lymphadenopathy is seen.  No acute  osseous abnormalities are identified.  Mild vacuum phenomenon is noted at L5-S1.  IMPRESSION:  1.  Question of mild appendicitis, given apparent dilatation of the appendix to 0.9 cm, and an appendicolith at the base of the appendix.  Evaluation for soft tissue inflammation is limited due  to surrounding structures; no evidence of perforation or abscess formation. 2.  Mild apparent wall thickening along the sigmoid colon is thought to reflect relative decompression, though a mild infectious process could have a similar appearance.  Per clinical correlation, this may be related to the patient's ongoing colonic process.  These results were called by telephone on 06/26/2012 at 03:51 a.m. to Dr. Devoria Albe, who verbally acknowledged these results.   Original Report Authenticated By: Tonia Ghent, M.D.    Review of Systems  Constitutional: Negative.   HENT: Negative.   Eyes: Negative.   Respiratory: Negative.   Cardiovascular: Negative.   Gastrointestinal: Positive for nausea and abdominal pain. Negative for vomiting.  Genitourinary: Negative.   Skin: Negative.   Neurological: Negative.   All other systems reviewed and are negative.    Blood pressure 132/86, pulse 78, temperature 97.9 F (36.6 C), temperature source Oral, resp. rate 18, last menstrual period 05/01/2012, SpO2 100.00%. Physical Exam  Constitutional: She is oriented to person, place, and time. She appears well-developed and well-nourished.  HENT:  Head: Normocephalic and atraumatic.  Eyes: Conjunctivae and EOM are normal. Pupils are equal, round, and reactive to light.  Neck: Normal range of motion. Neck supple.  Cardiovascular: Normal rate, regular rhythm and normal heart sounds.   Respiratory: Effort normal and breath sounds normal.  GI: Soft. Bowel sounds are normal. She exhibits no distension. There is tenderness (RLQ). There is no rebound and no guarding.  Musculoskeletal: Normal range of motion.  Neurological: She is alert and oriented to person, place, and time.     Assessment/Plan 42 y/o with acute apppedicitis -abx -consent OR  Danielle Meadows., Jed Limerick 06/26/2012, 5:53 AM

## 2012-06-26 NOTE — Transfer of Care (Signed)
Immediate Anesthesia Transfer of Care Note  Patient: Danielle Meadows  Procedure(s) Performed: Procedure(s): APPENDECTOMY LAPAROSCOPIC (N/A)  Patient Location: PACU  Anesthesia Type:General  Level of Consciousness: awake, alert  and oriented  Airway & Oxygen Therapy: Patient Spontanous Breathing and Patient connected to nasal cannula oxygen  Post-op Assessment: Report given to PACU RN and Post -op Vital signs reviewed and stable  Post vital signs: Reviewed and stable  Complications: No apparent anesthesia complications

## 2012-06-26 NOTE — ED Notes (Signed)
Patient transported to OR.

## 2012-06-26 NOTE — Op Note (Signed)
Preoperative diagnosis: Acute appendicitis Postoperative diagnosis: Same as above Procedure: Laparoscopic appendectomy Surgeon: Dr. Harden Mo Anesthesia: Gen. Drains: None Specimens: Appendix to pathology Complications: None Estimated blood loss: Minimal Sponge and needle count correct at end of operation Disposition to recovery stable  Indications: This is a 42 year old female presents with an elevated white blood cell count, right lower quadrant pain, and a CT scan consistent with possible appendicitis. I discussed going to the operating room and doing a laparoscopic appendectomy.  Procedure: After informed consent was obtained the patient was taken to the operating room. She was administered antibiotics in the emergency room. She had sequential compression devices placed. She was placed under general anesthesia without complication. Her abdomen was prepped and draped in the standard sterile surgical fashion. A surgical timeout was then performed.  A Foley catheter in place. I infiltrated Marcaine below her umbilicus. I made a curvilinear incision. I entered her fascia sharply. I identified her peritoneum and entered that with the Metzenbaum scissors. I placed a 0 Vicryl pursestring suture. I then inserted a Hassan trocar and insufflated to 15 mmHg pressure. I inserted 2 further 5 mm trocars in the suprapubic region and left lower quadrant under direct vision without complication. She was noted to have some purulence in her pelvis. I washed this out and evacuated. I then noted her to have acute appendicitis. I dissected the base. I then divided this with the GIA stapler. I used the harmonic scalpel to divide the mesentery. I placed the appendix in an Endo Catch bag and removed it from the abdomen. I then irrigated until this was clear. Hemostasis was observed. I then removed my Hassan trocar and close this with a 0 Vicryl suture. I placed 1 additional 0 Vicryl figure-of-eight suture and this  completely obliterated the defect. I then desufflated the abdomen and removed the remaining trocars. These were closed with 4-0 Monocryl and Dermabond. Her Foley was removed. She was extubated and transferred to recovery stable.

## 2012-06-26 NOTE — ED Notes (Signed)
Pt concerned that her sick dog is home alone, no one is able to go to her house and check on her.  Requesting to have CT and results read quickly. Requesting to go home and take dog to ER vet after CT completed.

## 2012-06-26 NOTE — Anesthesia Preprocedure Evaluation (Addendum)
Anesthesia Evaluation  Patient identified by MRN, date of birth, ID band Patient awake    Reviewed: Allergy & Precautions, H&P , NPO status   History of Anesthesia Complications Negative for: history of anesthetic complications  Airway Mallampati: I TM Distance: >3 FB     Dental  (+) Teeth Intact and Dental Advisory Given   Pulmonary Current Smoker,  breath sounds clear to auscultation        Cardiovascular Rhythm:Regular Rate:Normal     Neuro/Psych    GI/Hepatic   Endo/Other    Renal/GU      Musculoskeletal   Abdominal   Peds  Hematology   Anesthesia Other Findings   Reproductive/Obstetrics                          Anesthesia Physical Anesthesia Plan  ASA: II and emergent  Anesthesia Plan: General   Post-op Pain Management:    Induction: Intravenous  Airway Management Planned: Oral ETT  Additional Equipment:   Intra-op Plan:   Post-operative Plan: Extubation in OR  Informed Consent: I have reviewed the patients History and Physical, chart, labs and discussed the procedure including the risks, benefits and alternatives for the proposed anesthesia with the patient or authorized representative who has indicated his/her understanding and acceptance.   Dental advisory given  Plan Discussed with: Anesthesiologist and Surgeon  Anesthesia Plan Comments:        Anesthesia Quick Evaluation

## 2012-06-26 NOTE — Anesthesia Postprocedure Evaluation (Signed)
  Anesthesia Post-op Note  Patient: Danielle Meadows  Procedure(s) Performed: Procedure(s): APPENDECTOMY LAPAROSCOPIC (N/A)  Patient Location: PACU  Anesthesia Type:General  Level of Consciousness: awake  Airway and Oxygen Therapy: Patient Spontanous Breathing  Post-op Pain: mild  Post-op Assessment: Post-op Vital signs reviewed  Post-op Vital Signs: stable  Complications: No apparent anesthesia complications

## 2012-06-26 NOTE — Progress Notes (Signed)
Patient ID: Danielle Meadows, female   DOB: 1970/10/14, 42 y.o.   MRN: [id number] Patient with rlq pain, elevated wbc and ct with possible appendicitis.  She clinically appears to have appendicitis and will take to or now for laparoscopic appendectomy.  Risks/benefits have been discussed.

## 2012-06-27 MED ORDER — OXYCODONE HCL 5 MG PO TABS: 5.0000 mg | ORAL_TABLET | Freq: Four times a day (QID) | ORAL | Status: DC | PRN

## 2012-06-27 NOTE — Progress Notes (Signed)
Pt discharged to home

## 2012-06-27 NOTE — Discharge Summary (Signed)
Physician Discharge Summary  Patient ID: Danielle Meadows MRN: [id number] DOB/AGE: [birth year] 42 y.o.  Admit date: 06/26/2012 Discharge date: 06/27/2012  Admitting Diagnosis: Acute appendicitis  Discharge Diagnosis Patient Active Problem List   Diagnosis Date Noted  . Appendicitis, acute 06/25/2012    Consultants None  Imaging: Ct Abdomen Pelvis W Contrast  06/26/2012   *RADIOLOGY REPORT*  Clinical Data: Right lower quadrant abdominal pain and tenderness.  CT ABDOMEN AND PELVIS WITH CONTRAST  Technique:  Multidetector CT imaging of the abdomen and pelvis was performed following the standard protocol during bolus administration of intravenous contrast.  Contrast: 80mL OMNIPAQUE IOHEXOL 300 MG/ML  SOLN  Comparison: Abdominal radiograph performed 02/09/2011  Findings: The visualized lung bases are clear.  The liver and spleen are unremarkable in appearance.  The gallbladder is within normal limits.  The pancreas and adrenal glands are unremarkable.  The kidneys are unremarkable in appearance.  There is no evidence of hydronephrosis.  No renal or ureteral stones are seen.  No perinephric stranding is appreciated.  The small bowel is unremarkable in appearance.  The stomach is within normal limits.  No acute vascular abnormalities are seen.  The appendix is apparently dilated to 0.9 cm on coronal images, with a 0.7 cm appendicolith noted at the base of the appendix. There is mildly increased wall enhancement, and though evaluation for soft tissue inflammation is limited due to surrounding structures, the appearance suggests mild appendicitis.  The appendix runs adjacent to the right ovary.  Mild apparent wall thickening along the sigmoid colon is thought to reflect relative decompression.  The remainder of the colon is partially filled with stool, with fluid noted in the cecum and distal small bowel.  The bladder is moderately distended and grossly unremarkable in appearance.  The uterus is grossly  unremarkable.  The patient's intrauterine device is noted in expected position at the fundus of the uterus.  The ovaries are grossly symmetric, though difficult to fully assess.  No suspicious adnexal masses are seen.  Trace fluid within the pelvis is likely physiologic in nature.  No inguinal lymphadenopathy is seen.  No acute osseous abnormalities are identified.  Mild vacuum phenomenon is noted at L5-S1.  IMPRESSION:  1.  Question of mild appendicitis, given apparent dilatation of the appendix to 0.9 cm, and an appendicolith at the base of the appendix.  Evaluation for soft tissue inflammation is limited due to surrounding structures; no evidence of perforation or abscess formation. 2.  Mild apparent wall thickening along the sigmoid colon is thought to reflect relative decompression, though a mild infectious process could have a similar appearance.  Per clinical correlation, this may be related to the patient's ongoing colonic process.  These results were called by telephone on 06/26/2012 at 03:51 a.m. to Dr. Devoria Albe, who verbally acknowledged these results.   Original Report Authenticated By: Tonia Ghent, M.D.    Procedures Dr. Dwain Sarna (5/*26/14) - Laparoscopic Appendectomy  Hospital Course:  42 y/o F with RLW x12hr. Pain started approx 24 hr ago at umb and migrated to RLQ. Pt was evaluated by PCP at onset and sent home. Pain continued so came to [facility] for further evaluation.  CT in ED showerd dialted appendix and fecalith.  Patient was admitted and underwent procedure listed above.  Tolerated procedure well and was transferred to the floor.  Diet was advanced as tolerated.  On POD #1, the patient was voiding well, tolerating diet, ambulating well, pain well controlled, vital signs stable, incisions c/d/i and felt stable  for discharge home.  Patient will follow up in our office in 2 weeks and knows to call with questions or concerns.  Physical Exam: General:  Alert, NAD, pleasant,  comfortable Abd:  Soft, ND, mild tenderness, incisions C/D/I    Medication List    TAKE these medications       azithromycin 500 MG tablet  Commonly known as:  ZITHROMAX  Take 500 mg by mouth daily.     ibuprofen 200 MG tablet  Commonly known as:  ADVIL,MOTRIN  Take 200 mg by mouth every 6 (six) hours as needed for pain.     minocycline 100 MG capsule  Commonly known as:  MINOCIN,DYNACIN  Take 100 mg by mouth 2 (two) times daily.     multivitamin with minerals Tabs  Take 1 tablet by mouth daily.     oxyCODONE 5 MG immediate release tablet  Commonly known as:  Oxy IR/ROXICODONE  Take 1 tablet (5 mg total) by mouth every 6 (six) hours as needed.       Follow-up Information   Follow up with [facility] On 07/11/2012. (appt at 1:15pm, please arrive at 12:45pm for check in )    Contact information:   [address]   [place] [address] [phone]         Signed: Candiss [facility] [phone]  06/27/2012, 9:31 AM

## 2012-06-28 ENCOUNTER — Encounter (HOSPITAL_COMMUNITY): Payer: Self-pay | Admitting: General Surgery

## 2012-06-29 ENCOUNTER — Telehealth (INDEPENDENT_AMBULATORY_CARE_PROVIDER_SITE_OTHER): Payer: Self-pay | Admitting: General Surgery

## 2012-06-29 NOTE — Telephone Encounter (Signed)
Patient calling status post appendectomy on Monday with a couple questions. She is walking 20 minutes 3-4 times daily and wanted to make sure that is okay. I advised she can not walk too much at this point but to hold off on running/jogging for another week or two. She is also asking if swelling in her abdomen is normal. I explained that during laparoscopic surgery they use gas to expand the abdomen to safely to perform surgery and sometimes it takes a while for that gas to work its way out. She will keep an eye on it and call with any other questions.

## 2012-07-04 ENCOUNTER — Telehealth (INDEPENDENT_AMBULATORY_CARE_PROVIDER_SITE_OTHER): Payer: Self-pay | Admitting: *Deleted

## 2012-07-04 NOTE — Telephone Encounter (Signed)
Patient called to state she is having some mild tenderness around incision site.  Patient denies any redness, fevers, or drainage at this time.  Patient instructed to rest, Ibuprofen if needed and monitor.  Patient states understanding and agreeable at this time.

## 2012-07-11 ENCOUNTER — Encounter (INDEPENDENT_AMBULATORY_CARE_PROVIDER_SITE_OTHER): Payer: Self-pay

## 2012-07-11 ENCOUNTER — Ambulatory Visit (INDEPENDENT_AMBULATORY_CARE_PROVIDER_SITE_OTHER): Payer: BC Managed Care – PPO | Admitting: Internal Medicine

## 2012-07-11 VITALS — BP 112/64 | HR 88 | Temp 98.5°F | Resp 18 | Ht 63.0 in | Wt 117.6 lb

## 2012-07-11 DIAGNOSIS — K3580 Unspecified acute appendicitis: Secondary | ICD-10-CM

## 2012-07-11 NOTE — Progress Notes (Signed)
  Subjective: Pt returns to the clinic today after undergoing laparoscopic appendectomy on 5/26 by Dr. Dwain Sarna.  The patient is tolerating their diet well and is having no severe pain.  Bowel function is good.  No problems with the wounds.  However she is still very fatigued and is having moderate abdominal pain.  Some hard stools at times.  Objective: Vital signs in last 24 hours: Reviewed  PE: Abd: soft, non-tender, +bs, incisions well healed  Lab Results:  No results found for this basename: WBC, HGB, HCT, PLT,  in the last 72 hours BMET No results found for this basename: NA, K, CL, CO2, GLUCOSE, BUN, CREATININE, CALCIUM,  in the last 72 hours PT/INR No results found for this basename: LABPROT, INR,  in the last 72 hours CMP     Component Value Date/Time   NA 129* 06/25/2012 2300   K 3.3* 06/25/2012 2300   CL 92* 06/25/2012 2300   CO2 21 06/25/2012 2300   GLUCOSE 89 06/25/2012 2300   BUN 16 06/25/2012 2300   CREATININE 0.76 06/25/2012 2300   CALCIUM 9.6 06/25/2012 2300   PROT 6.8 06/25/2012 2300   ALBUMIN 4.2 06/25/2012 2300   AST 21 06/25/2012 2300   ALT 13 06/25/2012 2300   ALKPHOS 45 06/25/2012 2300   BILITOT 0.9 06/25/2012 2300   GFRNONAA >90 06/25/2012 2300   GFRAA >90 06/25/2012 2300   Lipase  No results found for this basename: lipase       Studies/Results: No results found.  Anti-infectives: Anti-infectives   None       Assessment/Plan  1.  S/P Laparoscopic Appendectomy: doing well, may resume regular activity without restrictions, use colace for hard stools, Pt will follow up with [place] PRN and knows to call with questions or concerns.     WHITE, ELIZABETH 07/11/2012

## 2012-07-11 NOTE — Patient Instructions (Signed)
May resume regular activity without restrictions. Follow up as needed. Call with questions or concerns.  

## 2012-07-14 ENCOUNTER — Telehealth (INDEPENDENT_AMBULATORY_CARE_PROVIDER_SITE_OTHER): Payer: Self-pay | Admitting: General Surgery

## 2012-07-14 NOTE — Telephone Encounter (Signed)
Pt called to ask what Clance Boll, PA-C, had instructed her to use (at last OV.)  Records reviewed and pt informed she had recommended using Colace.  Discussed dosage of it, and encouraged pt to walk AMAP and increase fluid intake as well.

## 2012-07-21 ENCOUNTER — Telehealth (INDEPENDENT_AMBULATORY_CARE_PROVIDER_SITE_OTHER): Payer: Self-pay | Admitting: General Surgery

## 2012-07-21 NOTE — Telephone Encounter (Signed)
Patient called in concerning a slight "pulling"  RLQ, denies any n/v, fever, chills. Last BM was this morning-small/slightly hard. Patient states she stopped Colace 3 days ago. This LPN advised to continue Colace, drink plenty fluids. If she continued to have this pulling sensation and feels uncomfortable that she could call our office on Monday to be scheduled with DOW on Tuesday.

## 2012-07-27 ENCOUNTER — Encounter (INDEPENDENT_AMBULATORY_CARE_PROVIDER_SITE_OTHER): Payer: Self-pay | Admitting: General Surgery

## 2012-07-27 ENCOUNTER — Other Ambulatory Visit (INDEPENDENT_AMBULATORY_CARE_PROVIDER_SITE_OTHER): Payer: Self-pay | Admitting: General Surgery

## 2012-07-27 ENCOUNTER — Ambulatory Visit (INDEPENDENT_AMBULATORY_CARE_PROVIDER_SITE_OTHER): Payer: BC Managed Care – PPO | Admitting: General Surgery

## 2012-07-27 ENCOUNTER — Other Ambulatory Visit (INDEPENDENT_AMBULATORY_CARE_PROVIDER_SITE_OTHER): Payer: Self-pay

## 2012-07-27 VITALS — BP 110/72 | HR 68 | Temp 99.6°F | Resp 15 | Ht 63.0 in | Wt 117.0 lb

## 2012-07-27 DIAGNOSIS — R1031 Right lower quadrant pain: Secondary | ICD-10-CM

## 2012-07-27 DIAGNOSIS — Z9889 Other specified postprocedural states: Secondary | ICD-10-CM

## 2012-07-27 NOTE — Progress Notes (Signed)
Procedure:  Laparoscopic appendectomy  Date:  06/26/2012  Pathology:  Acute appendicitis.  History:  She's been having some persistent right lower quadrant discomfort it feels a little similar to the discomfort she had with her appendicitis. No fever or chills. Some constipation and some diarrhea. No nausea or vomiting.  No dysuria or hematuria. She has a good appetite. The severity of the pain is increasing some. This led her to come into the office today for evaluation. She is back to doing her typical activities.  Exam: General- Is in NAD.  Abdomen soft, small incisions are clean and intact,Mild right lower quadrant discomfort to palpation, no palpable masses.  Assessment:  Right lower quadrant pain post appendectomy without obvious clinical etiology.  Plan:  Check CT scan of abdomen and pelvis. Check complete blood count and basic metabolic profile. Further recommendations based on these results.

## 2012-07-27 NOTE — Patient Instructions (Signed)
We will call you with the results. Please call us if you have not heard from [place] by 4 PM tomorrow.

## 2012-07-28 ENCOUNTER — Ambulatory Visit
Admission: RE | Admit: 2012-07-28 | Discharge: 2012-07-28 | Disposition: A | Discharge status: Home / Self Care | Payer: BC Managed Care – PPO | Source: Ambulatory Visit | Attending: General Surgery | Admitting: General Surgery

## 2012-07-28 DIAGNOSIS — R1031 Right lower quadrant pain: Secondary | ICD-10-CM

## 2012-07-28 LAB — CBC
HCT: 38.6 % (ref 36.0–46.0)
Hemoglobin: 13.7 g/dL (ref 12.0–15.0)
MCH: 35.2 pg — ABNORMAL HIGH (ref 26.0–34.0)
MCV: 99.2 fL (ref 78.0–100.0)
RBC: 3.89 MIL/uL (ref 3.87–5.11)

## 2012-07-28 LAB — BASIC METABOLIC PANEL
Chloride: 99 mEq/L (ref 96–112)
Potassium: 5 mEq/L (ref 3.5–5.3)
Sodium: 134 mEq/L — ABNORMAL LOW (ref 135–145)

## 2012-07-28 MED ORDER — IOHEXOL 300 MG/ML  SOLN
100.0000 mL | Freq: Once | INTRAMUSCULAR | Status: AC | PRN
  Administered 2012-07-28: 100 mL via INTRAVENOUS

## 2012-07-31 ENCOUNTER — Telehealth (INDEPENDENT_AMBULATORY_CARE_PROVIDER_SITE_OTHER): Payer: Self-pay

## 2012-07-31 NOTE — Telephone Encounter (Signed)
Discussed CT results with the patient.  Gave instructions on taking Miralax and Colace if she needs to.  Pt had a lot of questions about constipation, and was it specifically due to surgery?  All questions answered.  Pt will call in 48 hours if no results.

## 2012-08-03 NOTE — Telephone Encounter (Signed)
Patient called back. She is taking the miralax twice a day and is moving her bowels once in the morning but they are not moving much and they are not solid. She was calling to see if that is okay. I told her to stay on the miralax twice a day until she feels like she is back to normal and she can slowly cut back to once a day and then come off of it but that as long as her bowels are moving this sounds okay. She will call with any other questions.

## 2012-11-22 ENCOUNTER — Telehealth: Payer: Self-pay | Admitting: Medical

## 2012-11-22 ENCOUNTER — Ambulatory Visit (INDEPENDENT_AMBULATORY_CARE_PROVIDER_SITE_OTHER): Payer: BC Managed Care – PPO | Admitting: Medical

## 2012-11-22 ENCOUNTER — Encounter: Payer: Self-pay | Admitting: Medical

## 2012-11-22 VITALS — BP 116/82 | HR 90 | Temp 98.5°F | Resp 12 | Ht 62.0 in | Wt 124.0 lb

## 2012-11-22 DIAGNOSIS — N926 Irregular menstruation, unspecified: Secondary | ICD-10-CM

## 2012-11-22 DIAGNOSIS — R635 Abnormal weight gain: Secondary | ICD-10-CM

## 2012-11-22 DIAGNOSIS — R6889 Other general symptoms and signs: Secondary | ICD-10-CM

## 2012-11-22 DIAGNOSIS — N915 Oligomenorrhea, unspecified: Secondary | ICD-10-CM

## 2012-11-22 DIAGNOSIS — R141 Gas pain: Secondary | ICD-10-CM

## 2012-11-22 DIAGNOSIS — F17200 Nicotine dependence, unspecified, uncomplicated: Secondary | ICD-10-CM

## 2012-11-22 DIAGNOSIS — R140 Abdominal distension (gaseous): Secondary | ICD-10-CM

## 2012-11-22 DIAGNOSIS — Z8619 Personal history of other infectious and parasitic diseases: Secondary | ICD-10-CM

## 2012-11-22 LAB — POCT URINE PREGNANCY: Preg Test, Ur: NEGATIVE

## 2012-11-22 NOTE — Patient Instructions (Signed)
Secondary Amenorrhea   Secondary amenorrhea is the stopping of menstrual flow for 3 to 6 months in a female who has previously had periods. There are many possible causes. Most of these causes are not serious. Usually treating the underlying problem causing the loss of menses will return your periods to normal.  CAUSES   Some common and uncommon causes of not menstruating include:  · Malnutrition.  · Low blood sugar (hypoglycemia).  · Polycystic ovarian disease.  · Stress or fear.  · Breastfeeding.  · Hormone imbalance.  · Ovarian failure.  · Medications.  · Extreme obesity.  · Cystic fibrosis.  · Low body weight or drastic weight reduction from any cause.  · Early menopause.  · Removal of ovaries or uterus.  · Contraceptives.  · Illness.  · Long term (chronic) illnesses.  · Cushing's syndrome.  · Thyroid problems.  · Birth control pills, patches, or vaginal rings for birth control.  DIAGNOSIS   This diagnosis is made by your caregiver taking a medical history and doing a physical exam. Pregnancy must be ruled out. Often times, numerous blood tests of different hormones in the body may be measured. Urine testing may be done. Specialized x-rays may have to be done as well as measuring the body mass index (BMI).  TREATMENT   Treatment depends on the cause of the amenorrhea. If an eating disorder is present, this can be treated with an adequate diet and therapy. Chronic illnesses may improve with treatment of the illness. Overall, the outlook is good. The amenorrhea may be corrected with medications, lifestyle changes, or surgery. If the amenorrhea cannot be corrected, it is sometimes possible to create a false menstruation with medications.  Document Released: 03/01/2006 Document Revised: 04/12/2011 Document Reviewed: 01/06/2007  ExitCare® Patient Information ©2014 [employer].

## 2012-11-22 NOTE — Progress Notes (Signed)
Subjective:  Danielle Meadows is a 42 y.o. female who presents as a new patient today.  Was seeing [facility] prior.   She brings in records and even her laptop with her pulled up on the screen as I enter the room.    She notes begin diagnosed with Lyme disease about a year ago.  Was initially treated with Doxycycline, but after continuing to have symptoms, went to see a Lyme Disease specialist in [place], [place].   Was put on a 4 mo regimen of Minocycline 100mg  BID, Flagyl 500mg  BID for 2 days per week, and Azithromycin 500mg  daily.  She would like to resume this medications for another 22mo.  She is also here for other symptoms.  She notes that despite healthy diet, routine exercise, she has gained 10-15lb in the last 77mo.  She is not sleeping well, feels bloated, has cold intolernace and has a mental fog.   She is single, lives alone.  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  She discussed the same symptoms with her last doctors and felt like they just brushed her off.  She has a copper IUD.  No sexual activity in close to a year.  She notes LMP 03/2012, no period until Oct 6th when she had 2 days of bleeding.  Not sure if all this is hormonal but wants answers.  Wants another opinion.   No other aggravating or relieving factors.    No other c/o.  The following portions of the patient's history were reviewed and updated as appropriate: allergies, current medications, past family history, past medical history, past social history, past surgical history and problem list.  ROS Otherwise as in subjective above  Objective: Physical Exam  BP 116/82  Pulse 90  Temp(Src) 98.5 F (36.9 C) (Oral)  Resp 12  Ht 5\' 2"  (1.575 m)  Wt 124 lb (56.246 kg)  BMI 22.67 kg/m2  SpO2 98%   General appearance: alert, no distress, WD/WN, lean white female, muscular/fit appearing HEENT: normocephalic, sclerae anicteric, conjunctiva pink and moist, TMs pearly, nares patent, no discharge or erythema,  pharynx normal Oral cavity: MMM, no lesions Neck: supple, no lymphadenopathy, no thyromegaly, no masses Heart: RRR, normal S1, S2, no murmurs Lungs: CTA bilaterally, no wheezes, rhonchi, or rales Abdomen: +bs, soft, non tender, non distended, no masses, no hepatomegaly, no splenomegaly Pulses: 2+ radial pulses, 2+ pedal pulses, normal cap refill Ext: no edema   Assessment: Encounter Diagnoses  Name Primary?  . Oligomenorrhea Yes  . Irregular menstrual cycle   . Bloating   . Weight gain   . Cold intolerance   . History of Lyme disease   . Tobacco use disorder    Plan: Request records from Lyme disease specialist.   I reviewed labs from 2013 - 09/2012 that she had available.   Discussed possible causes of her symptoms.  differential - food allergy, food intolerance, hypothyroidism, hormonal, other.  advised that I would discuss case with Dr. Lynelle Doctor, supervising physician and call her back.  Follow up: pending call back

## 2012-11-22 NOTE — Telephone Encounter (Signed)
pls call and verify she has Paragard copper IUD?  After looking over her chart and symptoms, most likely explanation for her collection of symptoms could be hormonal, could be food intolerance or food allergy, less likely thyroid or other.  I notice looking in chart, she had discussed some similar symptoms with Dr. Stefano Gaul.  His notes advised that he referred to gastroenterology, likely to help eval for bloating and rule out celiac or other.   Did she ever go to GI?    At this point, we will await records, and I would recommend she keep food diary of all foods/beverages consumed x 3-5 days, and then return to further evaluate her concerns.  We can still consider the provera medication to help "reset" her cycle.

## 2012-11-24 NOTE — Telephone Encounter (Signed)
Pt does have pargard cooper IUD. She has not seen a GI , she didn't go. And she did start her cycle today. She said last time it only lasted for a day so if it last a day again she will call you to decide what the next step is to do

## 2012-11-24 NOTE — Telephone Encounter (Signed)
Have her call with update on period next week, consider recheck at her convenience if she wants to eval for bloating, food allergy, celiac, etc.

## 2012-11-27 NOTE — Telephone Encounter (Signed)
Left message on pt voicemail to call and schedule an appt for Danielle Meadows for bloating, food allergy and etc

## 2012-12-05 ENCOUNTER — Ambulatory Visit (INDEPENDENT_AMBULATORY_CARE_PROVIDER_SITE_OTHER): Payer: BC Managed Care – PPO | Admitting: Medical

## 2012-12-05 ENCOUNTER — Encounter: Payer: Self-pay | Admitting: Medical

## 2012-12-05 VITALS — BP 100/70 | HR 80 | Temp 97.5°F | Resp 16 | Wt 126.0 lb

## 2012-12-05 DIAGNOSIS — F17200 Nicotine dependence, unspecified, uncomplicated: Secondary | ICD-10-CM

## 2012-12-05 DIAGNOSIS — N915 Oligomenorrhea, unspecified: Secondary | ICD-10-CM

## 2012-12-05 DIAGNOSIS — R141 Gas pain: Secondary | ICD-10-CM

## 2012-12-05 DIAGNOSIS — R635 Abnormal weight gain: Secondary | ICD-10-CM

## 2012-12-05 DIAGNOSIS — R140 Abdominal distension (gaseous): Secondary | ICD-10-CM

## 2012-12-05 DIAGNOSIS — N926 Irregular menstruation, unspecified: Secondary | ICD-10-CM

## 2012-12-05 DIAGNOSIS — R6889 Other general symptoms and signs: Secondary | ICD-10-CM

## 2012-12-05 NOTE — Progress Notes (Signed)
Subjective:  Danielle Meadows is a 42 y.o. female who presents for recheck.  I saw her recently for several concerns.   Was seeing [facility] prior.   She notes begin diagnosed with Lyme disease about a year ago.  Was initially treated with Doxycycline, but after continuing to have symptoms, went to see a Lyme Disease specialist in [place], [place].   Was put on a 4 mo regimen of Minocycline 100mg  BID, Flagyl 500mg  BID for 2 days per week, and Azithromycin 500mg  daily.  She would like to resume this medications for another 459mo.  She is also here for other symptoms.  She notes that despite healthy diet, routine exercise, she has gained 10-15lb in the last 59mo.  Gained 2 lb in the last week.  She is not sleeping well, feels bloated, has cold intolerance and has a mental fog.   She is single, lives alone.  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  She discussed the same symptoms with her last doctors and felt like they just brushed her off.  She has a copper IUD.  No sexual activity in close to a year.  She notes LMP 03/2012, no period until Oct 6th when she had 2 days of bleeding.  Not sure if all this is hormonal but wants answers.  Wants another opinion.   Diet recall Eats very consistently daily.  Exercises a great deal daily between running and walking the dog.  Gets several hours of exercise daily.  Drinks 5-8 liters of water daily.  Avoids soda, no added salt in diet.  Drinks 1 espresso every morning.  Eats green smoothie with fruits and veggies each morning, uses green vibrance vitamin powder in her smoothie.  Eats eggs with vegetables for mid morning snack.  Lunch usually is salad and baked or grilled chicken.  Eats protein drink with fruits and vegetables for afternoon snack.  Dinner is usually chicken or fish and fruits and vegetable.  Cooks with coconut or olive oil   No other aggravating or relieving factors.    No other c/o.  The following portions of the patient's history were reviewed  and updated as appropriate: allergies, current medications, past family history, past medical history, past social history, past surgical history and problem list.  ROS Otherwise as in subjective above  Objective: Physical Exam  BP 100/70  Pulse 80  Temp(Src) 97.5 F (36.4 C) (Oral)  Resp 16  Wt 126 lb (57.153 kg)   General appearance: alert, no distress, WD/WN, lean white female, muscular/fit appearing HEENT: normocephalic, sclerae anicteric, conjunctiva pink and moist, TMs pearly, nares patent, no discharge or erythema, pharynx normal Oral cavity: MMM, no lesions Neck: supple, no lymphadenopathy, no thyromegaly, no masses Heart: RRR, normal S1, S2, no murmurs Lungs: CTA bilaterally, no wheezes, rhonchi, or rales Abdomen: +bs, soft, non tender, non distended, no masses, no hepatomegaly, no splenomegaly Pulses: 2+ radial pulses, 2+ pedal pulses, normal cap refill Ext: no edema   Assessment: Encounter Diagnoses  Name Primary?  . Bloating Yes  . Scanty or infrequent menstruation   . Irregular menstrual cycle   . Abnormal weight gain   . Other general symptoms(780.99)   . Tobacco use disorder    Plan: Food allergy lab panel today.   Reviewed prior labs, records.  Will consult with endocrinology, Dr. Sharl Ma.   will request prior endocrinology records from Dr. Sharl Ma.  May need to consult with Dr. Stefano Gaul her gynecologist.   Will likely refer back to endocrinology.

## 2012-12-06 ENCOUNTER — Telehealth: Payer: Self-pay | Admitting: Medical

## 2012-12-06 LAB — ALLERGEN FOOD PROFILE SPECIFIC IGE
Chicken IgE: <0.1 kU/L
Corn: <0.1 kU/L
Fish Cod: <0.1 kU/L
IgE (Immunoglobulin E), Serum: 23.2 IU/mL (ref 0.0–180.0)
Peanut IgE: <0.1 kU/L
Soybean IgE: <0.1 kU/L
Wheat IgE: <0.1 kU/L

## 2012-12-06 NOTE — Telephone Encounter (Signed)
Records are already in your folder. It was done yesterday

## 2012-12-06 NOTE — Telephone Encounter (Signed)
Where is my folder?  I have seen labs, but no OV notes from Dr. Sharl Ma.   Help me find them please.

## 2012-12-06 NOTE — Telephone Encounter (Signed)
pls call and get Dr. Kerr/endocrinology records.   She saw him last year.  Need labs, OVnotes

## 2012-12-07 ENCOUNTER — Other Ambulatory Visit: Payer: Self-pay

## 2012-12-15 ENCOUNTER — Other Ambulatory Visit: Payer: Self-pay | Admitting: Family Medicine

## 2012-12-15 DIAGNOSIS — N926 Irregular menstruation, unspecified: Secondary | ICD-10-CM

## 2013-05-16 ENCOUNTER — Emergency Department (HOSPITAL_COMMUNITY): Payer: BC Managed Care – PPO

## 2013-05-16 ENCOUNTER — Emergency Department (HOSPITAL_COMMUNITY)
Admission: EM | Admit: 2013-05-16 | Discharge: 2013-05-16 | Disposition: A | Discharge status: Home / Self Care | Payer: BC Managed Care – PPO | Attending: Emergency Medicine | Admitting: Emergency Medicine

## 2013-05-16 ENCOUNTER — Other Ambulatory Visit: Payer: Self-pay

## 2013-05-16 ENCOUNTER — Encounter (HOSPITAL_COMMUNITY): Payer: Self-pay | Admitting: Emergency Medicine

## 2013-05-16 DIAGNOSIS — M546 Pain in thoracic spine: Secondary | ICD-10-CM | POA: Insufficient documentation

## 2013-05-16 DIAGNOSIS — Z789 Other specified health status: Secondary | ICD-10-CM | POA: Insufficient documentation

## 2013-05-16 DIAGNOSIS — R079 Chest pain, unspecified: Secondary | ICD-10-CM | POA: Insufficient documentation

## 2013-05-16 DIAGNOSIS — F17200 Nicotine dependence, unspecified, uncomplicated: Secondary | ICD-10-CM | POA: Insufficient documentation

## 2013-05-16 DIAGNOSIS — Z79899 Other long term (current) drug therapy: Secondary | ICD-10-CM | POA: Insufficient documentation

## 2013-05-16 DIAGNOSIS — Z8679 Personal history of other diseases of the circulatory system: Secondary | ICD-10-CM | POA: Insufficient documentation

## 2013-05-16 LAB — CBC
HCT: 39.2 % (ref 36.0–46.0)
HEMOGLOBIN: 13.8 g/dL (ref 12.0–15.0)
MCH: 35.9 pg — AB (ref 26.0–34.0)
MCHC: 35.2 g/dL (ref 30.0–36.0)
MCV: 102.1 fL — AB (ref 78.0–100.0)
PLATELETS: 239 10*3/uL (ref 150–400)
RBC: 3.84 MIL/uL — AB (ref 3.87–5.11)
RDW: 12.8 % (ref 11.5–15.5)
WBC: 5.5 10*3/uL (ref 4.0–10.5)

## 2013-05-16 LAB — HEPATIC FUNCTION PANEL
ALK PHOS: 32 U/L — AB (ref 39–117)
ALT: 21 U/L (ref 0–35)
AST: 35 U/L (ref 0–37)
Albumin: 4.2 g/dL (ref 3.5–5.2)
BILIRUBIN TOTAL: 0.5 mg/dL (ref 0.3–1.2)
Total Protein: 7.2 g/dL (ref 6.0–8.3)

## 2013-05-16 LAB — BASIC METABOLIC PANEL
BUN: 16 mg/dL (ref 6–23)
CALCIUM: 9.2 mg/dL (ref 8.4–10.5)
CO2: 23 meq/L (ref 19–32)
CREATININE: 0.65 mg/dL (ref 0.50–1.10)
Chloride: 105 mEq/L (ref 96–112)
GFR calc Af Amer: >90 mL/min (ref >90)
GLUCOSE: 103 mg/dL — AB (ref 70–99)
Potassium: 4.3 mEq/L (ref 3.7–5.3)
SODIUM: 139 meq/L (ref 137–147)

## 2013-05-16 LAB — I-STAT TROPONIN, ED
Troponin i, poc: 0 ng/mL (ref 0.00–0.08)
Troponin i, poc: 0 ng/mL (ref 0.00–0.08)

## 2013-05-16 LAB — LIPASE, BLOOD: Lipase: 30 U/L (ref 11–59)

## 2013-05-16 NOTE — ED Notes (Signed)
Contacted Velna HatchetSheila from lab states will run Lipase and Hepatic Function at this time.

## 2013-05-16 NOTE — ED Provider Notes (Signed)
CSN: [id number]     Arrival date & time 05/16/13  1143 History   First MD Initiated Contact with Patient 05/16/13 1320     Chief Complaint  Patient presents with  . Chest Pain  . Back Pain     (Consider location/radiation/quality/duration/timing/severity/associated sxs/prior Treatment) Patient is a 43 y.o. female presenting with chest pain and back pain. The history is provided by the patient (the patient states she had some upper back pain and chest pain today).  Chest Pain Pain location:  L chest Pain quality: aching   Pain radiates to:  Upper back Pain radiates to the back: no   Pain severity:  Moderate Onset quality:  Sudden Associated symptoms: back pain   Associated symptoms: no abdominal pain, no cough, no fatigue and no headache   Back Pain Associated symptoms: chest pain   Associated symptoms: no abdominal pain and no headaches     Past Medical History  Diagnosis Date  . Abdominal pain 12/2010  . Bloating 12/2010  . Urinary frequency 12/2010  . Abnormal Pap smear 11/19/10    ASCUS  . Migraine   . Wears glasses   . IUD (intrauterine device) in place 2008    [place]   Past Surgical History  Procedure Laterality Date  . Laparoscopic appendectomy N/A 06/26/2012    Procedure: APPENDECTOMY LAPAROSCOPIC;  Surgeon: Emelia LoronMatthew Wakefield, MD;  [facility];  Service: General;  Laterality: N/A;  . Appendectomy  04/26/2012  . Fracture surgery  04/2006    Face   Family History  Problem Relation Age of Onset  . Cancer Maternal Grandmother     Breast   History  Substance Use Topics  . Smoking status: Current Every Day Smoker -- 1.00 packs/day    Types: Cigarettes  . Smokeless tobacco: Never Used  . Alcohol Use: 2.4 oz/week    4 Glasses of wine per week     Comment: Several times/week.   OB History   Grav Para Term Preterm Abortions TAB SAB Ect Mult Living   1 0   1     0     Review of Systems  Constitutional: Negative for appetite change and fatigue.   HENT: Negative for congestion, ear discharge and sinus pressure.   Eyes: Negative for discharge.  Respiratory: Negative for cough.   Cardiovascular: Positive for chest pain.  Gastrointestinal: Negative for abdominal pain and diarrhea.  Genitourinary: Negative for frequency and hematuria.  Musculoskeletal: Positive for back pain.  Skin: Negative for rash.  Neurological: Negative for seizures and headaches.  Psychiatric/Behavioral: Negative for hallucinations.      Allergies  Review of patient's allergies indicates no known allergies.  Home Medications   Prior to Admission medications   Medication Sig Start Date End Date Taking? Authorizing Provider  Cholecalciferol (VITAMIN D PO) Take 1 tablet by mouth daily.   Yes Historical Provider, MD  MAGNESIUM PO Take 1 tablet by mouth daily.   Yes Historical Provider, MD  Multiple Vitamin (MULTIVITAMIN WITH MINERALS) TABS Take 1 tablet by mouth daily.   Yes Historical Provider, MD   BP 135/87  Pulse 72  Temp(Src) 98 F (36.7 C) (Oral)  Resp 18  Ht 5\' 4"  (1.626 m)  Wt 126 lb (57.153 kg)  BMI 21.62 kg/m2  SpO2 100%  LMP 05/03/2013 Physical Exam  Constitutional: She is oriented to person, place, and time. She appears well-developed.  HENT:  Head: Normocephalic.  Eyes: Conjunctivae and EOM are normal. No scleral icterus.  Neck: Neck supple.  No thyromegaly present.  Cardiovascular: Normal rate and regular rhythm.  Exam reveals no gallop and no friction rub.   No murmur heard. Pulmonary/Chest: No stridor. She has no wheezes. She has no rales. She exhibits no tenderness.  Abdominal: She exhibits no distension. There is no tenderness. There is no rebound.  Musculoskeletal: Normal range of motion. She exhibits no edema.  Lymphadenopathy:    She has no cervical adenopathy.  Neurological: She is oriented to person, place, and time. She exhibits normal muscle tone. Coordination normal.  Skin: No rash noted. No erythema.  Psychiatric: She  has a normal mood and affect. Her behavior is normal.    ED Course  Procedures (including critical care time) Labs Review Labs Reviewed  CBC - Abnormal; Notable for the following:    RBC 3.84 (*)    MCV 102.1 (*)    MCH 35.9 (*)    All other components within normal limits  BASIC METABOLIC PANEL - Abnormal; Notable for the following:    Glucose, Bld 103 (*)    All other components within normal limits  HEPATIC FUNCTION PANEL - Abnormal; Notable for the following:    Alkaline Phosphatase 32 (*)    All other components within normal limits  LIPASE, BLOOD  I-STAT TROPOININ, ED  Rosezena SensorI-STAT TROPOININ, ED    Imaging Review Dg Chest 2 View  05/16/2013   CLINICAL DATA:  Chest pain  EXAM: CHEST  2 VIEW  COMPARISON:  10/18/2011  FINDINGS: Cardiac shadow is within normal limits. The lungs are well aerated bilaterally. A few small calcifications are noted likely representing granulomas. No other focal abnormality is noted.  IMPRESSION: No acute abnormality is seen.   Electronically Signed   By: Alcide CleverMark  Lukens M.D.   On: 05/16/2013 12:50     EKG Interpretation   Date/Time:  Wednesday May 16 2013 11:50:55 EDT Ventricular Rate:  81 PR Interval:  148 QRS Duration: 82 QT Interval:  372 QTC Calculation: 432 R Axis:   97 Text Interpretation:  Normal sinus rhythm Septal infarct , age  undetermined Lateral infarct , age undetermined Abnormal ECG Confirmed by  Hanah Moultry  MD, Harveer Sadler [phone]) on 05/16/2013 1:22:53 PM      MDM   Final diagnoses:  Chest pain        Benny LennertJoseph L Jamarl Pew, MD 05/16/13 1517

## 2013-05-16 NOTE — Discharge Instructions (Signed)
Follow up with your family md in one week and follow up with cardiology in 1-2 weeks

## 2013-05-16 NOTE — ED Notes (Signed)
Pt comfortable with discharge and follow up instructions. No prescriptions given. 

## 2013-05-16 NOTE — ED Notes (Addendum)
Keisha from lab notified of need to add-on Lipase/Hepatic function Panel.

## 2013-05-16 NOTE — ED Notes (Signed)
Pt states she began having L side CP about an hour ago.  Pt states it feels like someone is stabbing through the chest into her back.  Pt also reports numb feeling to L hand.

## 2013-12-03 ENCOUNTER — Encounter (HOSPITAL_COMMUNITY): Payer: Self-pay | Admitting: Emergency Medicine

## 2014-03-13 IMAGING — CT CT ABD-PELV W/ CM
2 of 5 series · 13 of 32 positions shown, 18 images · IV contrast (READICAT/WATER & [ID] OMNI 300)
Comparison: CT abdomen pelvis of 06/26/2012

CLINICAL DATA: Status post laparoscopic appendectomy on 06/26/2012,
now with right lower quadrant abdominal pain

CT ABDOMEN AND PELVIS WITH CONTRAST
TECHNIQUE: Multidetector CT imaging of the abdomen and pelvis was
performed following the standard protocol during bolus
administration of intravenous contrast.
Contrast: 100mL OMNIPAQUE IOHEXOL 300 MG/ML  SOLN

[Series 2: abd/pelvis with · axial · 0.66mm/px · z∈[-340,-40]mm · 6 of 85 slices shown, 11 images]
[im 13/85  soft-tissue]
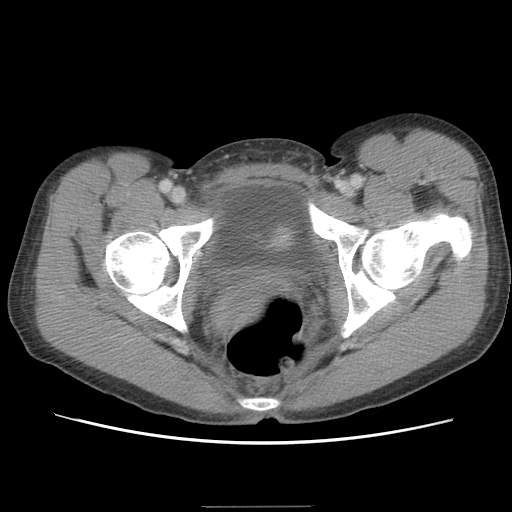
[im 13/85  bone]
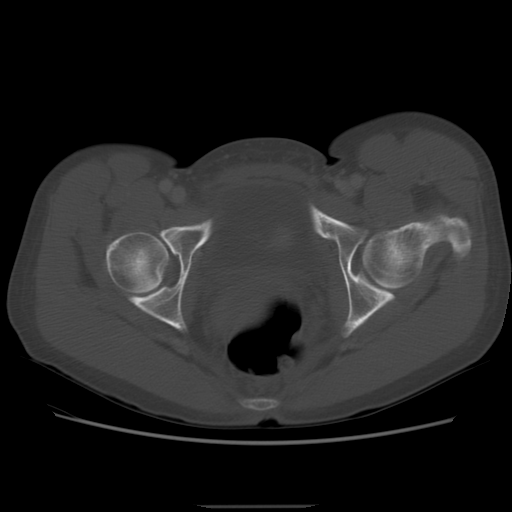
[im 25/85  soft-tissue]
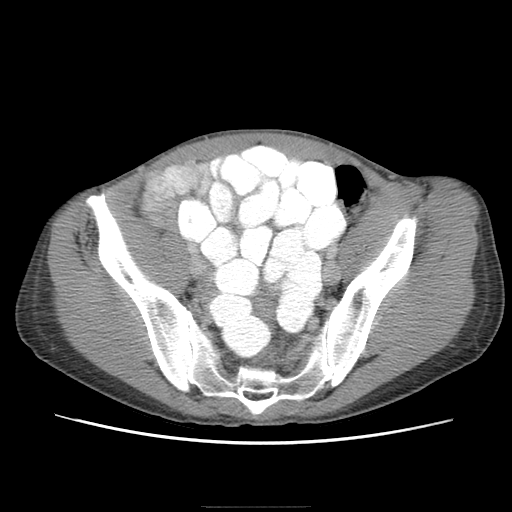
[im 37/85  soft-tissue]
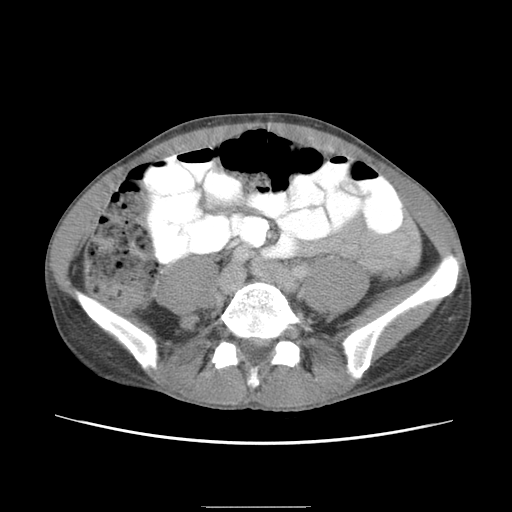
[im 37/85  lung]
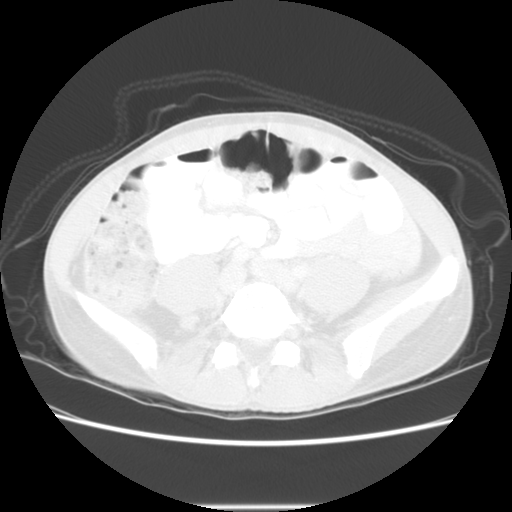
[im 49/85  soft-tissue]
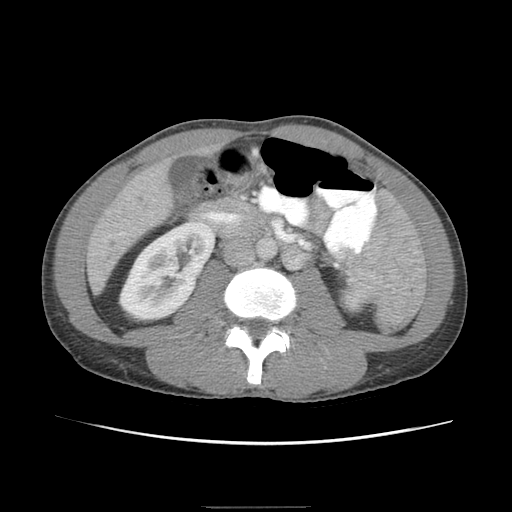
[im 49/85  lung]
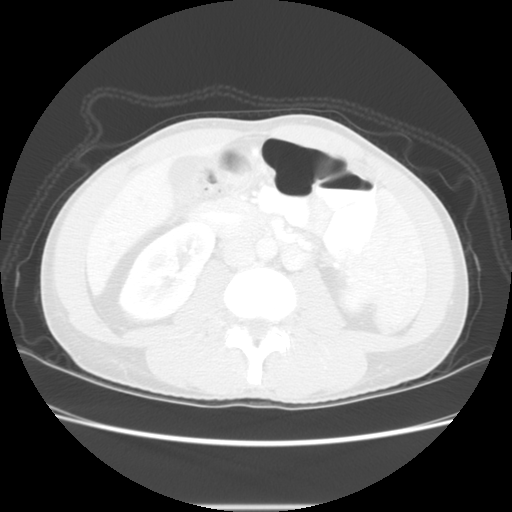
[im 61/85  soft-tissue]
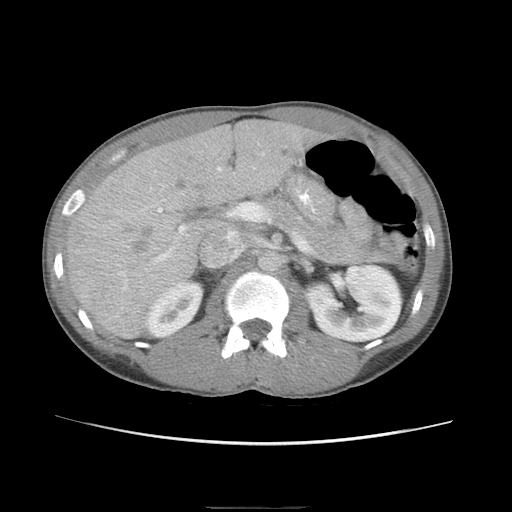
[im 61/85  lung]
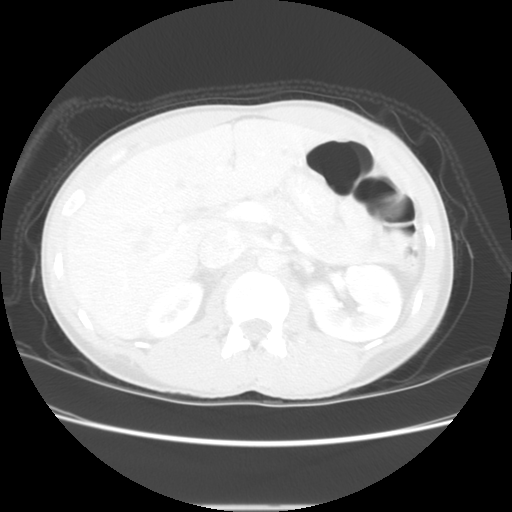
[im 73/85  soft-tissue]
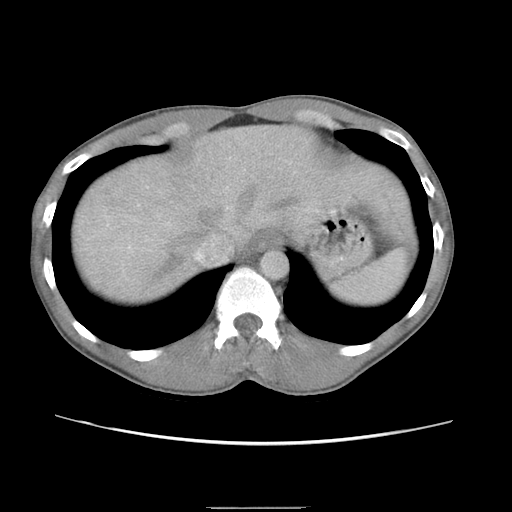
[im 73/85  lung]
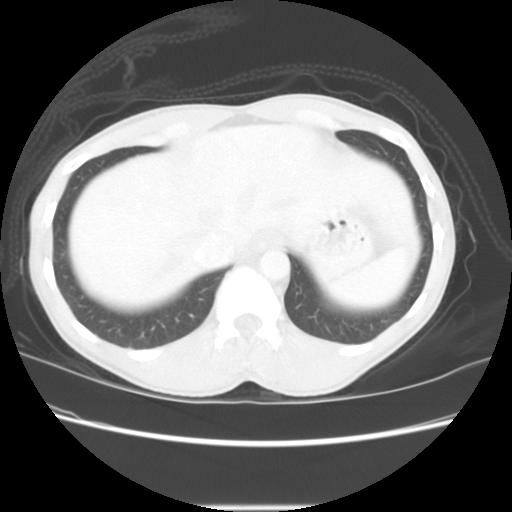

[Series 400: sag · sagittal · 0.86mm/px · 7 of 116 slices shown]
[im 12/116  soft-tissue]
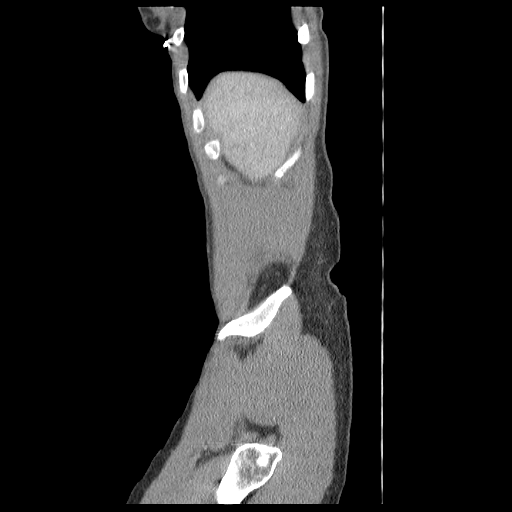
[im 24/116  soft-tissue]
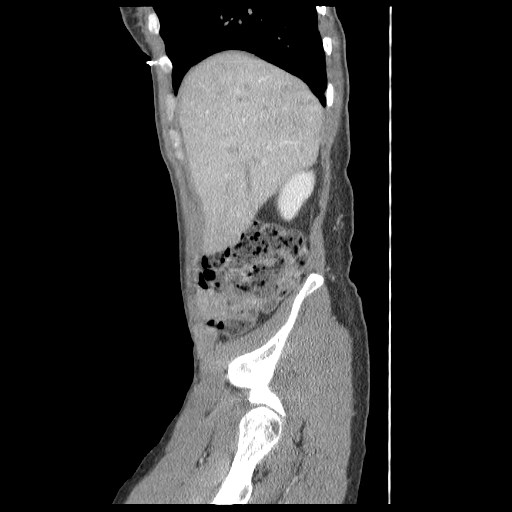
[im 35/116  soft-tissue]
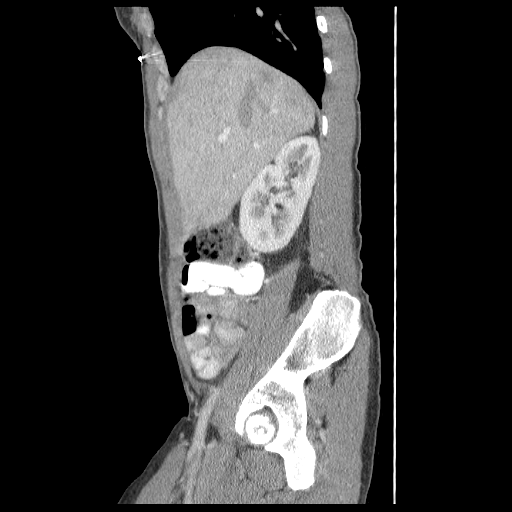
[im 47/116  soft-tissue]
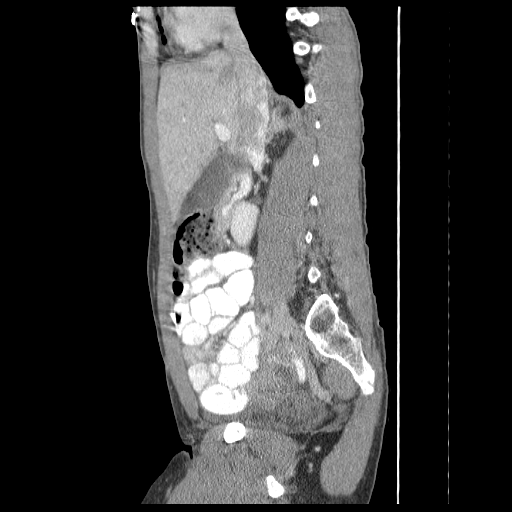
[im 70/116  soft-tissue]
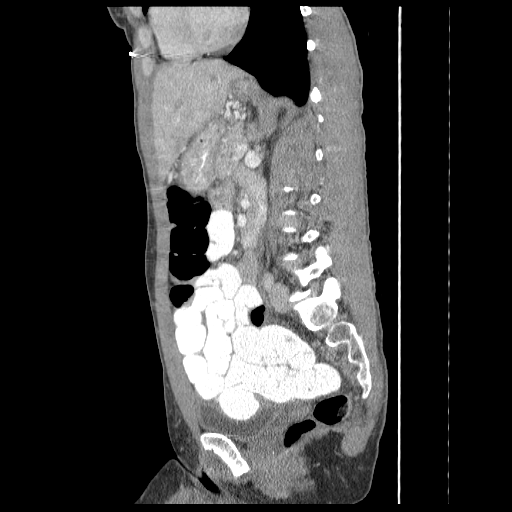
[im 81/116  soft-tissue]
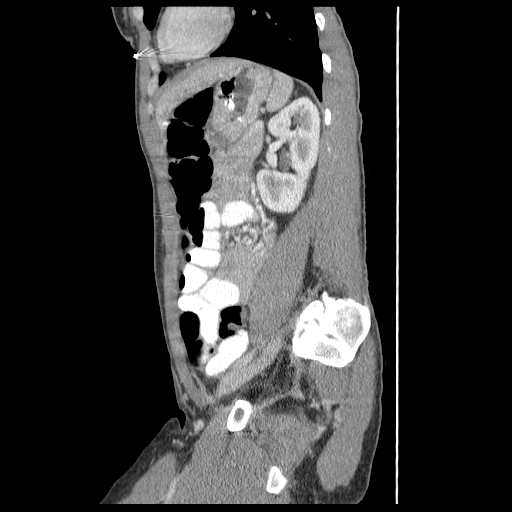
[im 93/116  soft-tissue]
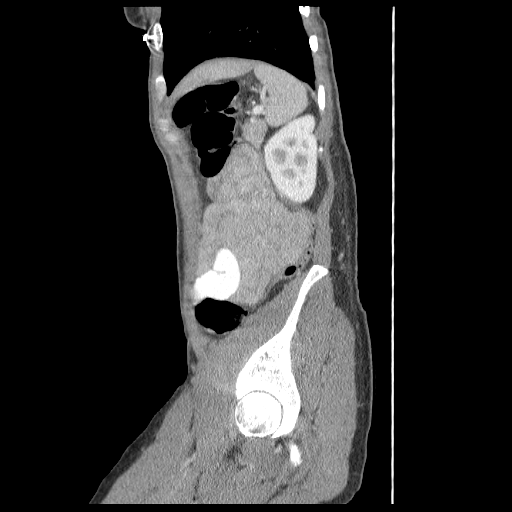

[13 of 32 positions shown; findings below may reference images not displayed]

FINDINGS: The lung bases are clear.  The liver enhances with no
focal abnormality and no ductal dilatation is seen.  No calcified
gallstones are noted.  The pancreas is normal in size and the
pancreatic duct is not dilated.  The adrenal glands and spleen are
unremarkable. The calcification in the parenchyma of the upper pole
of the left kidney appears to be associated with a focus of
parenchymal loss and most likely is dystrophic in nature.  No renal
or ureteral calculi are noted.  On delayed images, the
pelvocaliceal systems are unremarkable in the proximal ureters are
normal in caliber.  The abdominal aorta is normal in caliber.

A surgical clip is noted in the right lower quadrant after
appendectomy.  No abscess or free fluid is seen.  No edema of the
right colon is noted.  There are ovarian follicles present
particular on the right but no significant free fluid is noted.
The urinary bladder is not well distended.  The uterus is
anteverted and an IUD is noted centrally.  Much of the left colon
is decompressed and there is feces throughout the right colon.
There is age advanced degenerative disc disease at the L5-S1 level.
IMPRESSION: 1.  No complicating features from recent appendectomy are noted
within the right lower quadrant.  There is a moderate to large
amount of feces throughout the right colon.
2.  An  IUD is centrally located within the uterus.
3.  Right ovarian follicles.  No free fluid.
4.  Probable dystrophic calcification in the upper pole of the left
kidney associated with a focus of parenchymal loss.
